# Patient Record
Sex: Female | Born: 1945 | Race: Black or African American | Hispanic: No | Smoking: Never smoker
Health system: Southern US, Community
[De-identification: ages and names within clinical notes are randomized; demographics above are authoritative.]

## PROBLEM LIST (undated history)

## (undated) DIAGNOSIS — M8580 Other specified disorders of bone density and structure, unspecified site: Secondary | ICD-10-CM

## (undated) DIAGNOSIS — K219 Gastro-esophageal reflux disease without esophagitis: Secondary | ICD-10-CM

## (undated) DIAGNOSIS — D0510 Intraductal carcinoma in situ of unspecified breast: Secondary | ICD-10-CM

## (undated) DIAGNOSIS — O039 Complete or unspecified spontaneous abortion without complication: Secondary | ICD-10-CM

## (undated) DIAGNOSIS — M353 Polymyalgia rheumatica: Secondary | ICD-10-CM

## (undated) DIAGNOSIS — F329 Major depressive disorder, single episode, unspecified: Secondary | ICD-10-CM

## (undated) DIAGNOSIS — K449 Diaphragmatic hernia without obstruction or gangrene: Secondary | ICD-10-CM

## (undated) DIAGNOSIS — K648 Other hemorrhoids: Secondary | ICD-10-CM

## (undated) DIAGNOSIS — K2970 Gastritis, unspecified, without bleeding: Secondary | ICD-10-CM

## (undated) DIAGNOSIS — I10 Essential (primary) hypertension: Secondary | ICD-10-CM

## (undated) DIAGNOSIS — M1990 Unspecified osteoarthritis, unspecified site: Secondary | ICD-10-CM

## (undated) DIAGNOSIS — F32A Depression, unspecified: Secondary | ICD-10-CM

## (undated) HISTORY — DX: Polymyalgia rheumatica: M35.3

## (undated) HISTORY — DX: Diaphragmatic hernia without obstruction or gangrene: K44.9

## (undated) HISTORY — PX: BUNIONECTOMY: SHX129

## (undated) HISTORY — DX: Intraductal carcinoma in situ of unspecified breast: D05.10

## (undated) HISTORY — DX: Unspecified osteoarthritis, unspecified site: M19.90

## (undated) HISTORY — PX: BREAST LUMPECTOMY: SHX2

## (undated) HISTORY — PX: TOE SURGERY: SHX1073

## (undated) HISTORY — DX: Gastritis, unspecified, without bleeding: K29.70

## (undated) HISTORY — DX: Other hemorrhoids: K64.8

## (undated) HISTORY — PX: CERVICAL CONE BIOPSY: SUR198

## (undated) HISTORY — PX: OTHER SURGICAL HISTORY: SHX169

## (undated) HISTORY — DX: Depression, unspecified: F32.A

## (undated) HISTORY — PX: CARPAL TUNNEL RELEASE: SHX101

## (undated) HISTORY — DX: Major depressive disorder, single episode, unspecified: F32.9

## (undated) HISTORY — DX: Essential (primary) hypertension: I10

## (undated) HISTORY — DX: Other specified disorders of bone density and structure, unspecified site: M85.80

## (undated) HISTORY — PX: DILATION AND CURETTAGE OF UTERUS: SHX78

## (undated) HISTORY — DX: Complete or unspecified spontaneous abortion without complication: O03.9

## (undated) HISTORY — DX: Gastro-esophageal reflux disease without esophagitis: K21.9

---

## 1998-06-15 ENCOUNTER — Other Ambulatory Visit: Admission: RE | Admit: 1998-06-15 | Discharge: 1998-06-15 | Payer: Self-pay | Admitting: Gynecology

## 1998-10-26 ENCOUNTER — Ambulatory Visit (HOSPITAL_COMMUNITY): Admission: RE | Admit: 1998-10-26 | Discharge: 1998-10-26 | Payer: Self-pay | Admitting: Gastroenterology

## 1998-11-07 ENCOUNTER — Other Ambulatory Visit: Admission: RE | Admit: 1998-11-07 | Discharge: 1998-11-07 | Payer: Self-pay | Admitting: Gynecology

## 1999-07-11 ENCOUNTER — Other Ambulatory Visit: Admission: RE | Admit: 1999-07-11 | Discharge: 1999-07-11 | Payer: Self-pay | Admitting: Gynecology

## 1999-08-11 ENCOUNTER — Encounter (INDEPENDENT_AMBULATORY_CARE_PROVIDER_SITE_OTHER): Payer: Self-pay

## 1999-08-11 ENCOUNTER — Other Ambulatory Visit: Admission: RE | Admit: 1999-08-11 | Discharge: 1999-08-11 | Payer: Self-pay | Admitting: Gynecology

## 1999-09-06 ENCOUNTER — Encounter: Payer: Self-pay | Admitting: Family Medicine

## 1999-09-06 ENCOUNTER — Ambulatory Visit (HOSPITAL_COMMUNITY): Admission: RE | Admit: 1999-09-06 | Discharge: 1999-09-06 | Payer: Self-pay | Admitting: Family Medicine

## 1999-09-21 ENCOUNTER — Encounter (INDEPENDENT_AMBULATORY_CARE_PROVIDER_SITE_OTHER): Payer: Self-pay | Admitting: Specialist

## 1999-09-21 ENCOUNTER — Other Ambulatory Visit: Admission: RE | Admit: 1999-09-21 | Discharge: 1999-09-21 | Payer: Self-pay | Admitting: Gynecology

## 1999-12-27 ENCOUNTER — Encounter: Payer: Self-pay | Admitting: Gynecology

## 1999-12-27 ENCOUNTER — Encounter: Admission: RE | Admit: 1999-12-27 | Discharge: 1999-12-27 | Payer: Self-pay | Admitting: Gynecology

## 2000-05-08 ENCOUNTER — Other Ambulatory Visit: Admission: RE | Admit: 2000-05-08 | Discharge: 2000-05-08 | Payer: Self-pay | Admitting: Gynecology

## 2000-05-09 ENCOUNTER — Other Ambulatory Visit: Admission: RE | Admit: 2000-05-09 | Discharge: 2000-05-09 | Payer: Self-pay | Admitting: Gynecology

## 2000-05-09 ENCOUNTER — Encounter (INDEPENDENT_AMBULATORY_CARE_PROVIDER_SITE_OTHER): Payer: Self-pay

## 2000-10-02 ENCOUNTER — Other Ambulatory Visit: Admission: RE | Admit: 2000-10-02 | Discharge: 2000-10-02 | Payer: Self-pay | Admitting: Gynecology

## 2001-02-14 ENCOUNTER — Encounter: Payer: Self-pay | Admitting: Gynecology

## 2001-02-14 ENCOUNTER — Encounter: Admission: RE | Admit: 2001-02-14 | Discharge: 2001-02-14 | Payer: Self-pay | Admitting: Gynecology

## 2001-02-18 ENCOUNTER — Other Ambulatory Visit: Admission: RE | Admit: 2001-02-18 | Discharge: 2001-02-18 | Payer: Self-pay | Admitting: Gynecology

## 2001-12-11 ENCOUNTER — Encounter: Payer: Self-pay | Admitting: Gynecology

## 2001-12-11 ENCOUNTER — Encounter: Admission: RE | Admit: 2001-12-11 | Discharge: 2001-12-11 | Payer: Self-pay | Admitting: Gynecology

## 2002-12-30 ENCOUNTER — Encounter: Admission: RE | Admit: 2002-12-30 | Discharge: 2002-12-30 | Payer: Self-pay | Admitting: Gynecology

## 2002-12-30 ENCOUNTER — Encounter: Payer: Self-pay | Admitting: Gynecology

## 2003-01-26 ENCOUNTER — Other Ambulatory Visit: Admission: RE | Admit: 2003-01-26 | Discharge: 2003-01-26 | Payer: Self-pay | Admitting: Gynecology

## 2004-01-03 ENCOUNTER — Encounter: Admission: RE | Admit: 2004-01-03 | Discharge: 2004-01-03 | Payer: Self-pay | Admitting: Gynecology

## 2004-01-11 ENCOUNTER — Encounter (INDEPENDENT_AMBULATORY_CARE_PROVIDER_SITE_OTHER): Payer: Self-pay | Admitting: Specialist

## 2004-01-11 ENCOUNTER — Encounter: Admission: RE | Admit: 2004-01-11 | Discharge: 2004-01-11 | Payer: Self-pay | Admitting: Gynecology

## 2004-01-17 ENCOUNTER — Encounter (HOSPITAL_COMMUNITY): Admission: RE | Admit: 2004-01-17 | Discharge: 2004-04-16 | Payer: Self-pay | Admitting: *Deleted

## 2004-02-09 ENCOUNTER — Other Ambulatory Visit: Admission: RE | Admit: 2004-02-09 | Discharge: 2004-02-09 | Payer: Self-pay | Admitting: Gynecology

## 2004-10-15 DIAGNOSIS — D0510 Intraductal carcinoma in situ of unspecified breast: Secondary | ICD-10-CM

## 2004-10-15 HISTORY — DX: Intraductal carcinoma in situ of unspecified breast: D05.10

## 2004-11-29 ENCOUNTER — Ambulatory Visit (HOSPITAL_COMMUNITY): Admission: RE | Admit: 2004-11-29 | Discharge: 2004-11-29 | Payer: Self-pay | Admitting: Cardiology

## 2005-01-03 ENCOUNTER — Ambulatory Visit: Payer: Self-pay | Admitting: Internal Medicine

## 2005-02-08 ENCOUNTER — Ambulatory Visit: Payer: Self-pay | Admitting: Internal Medicine

## 2005-07-09 ENCOUNTER — Other Ambulatory Visit: Admission: RE | Admit: 2005-07-09 | Discharge: 2005-07-09 | Payer: Self-pay | Admitting: Gynecology

## 2006-07-17 ENCOUNTER — Other Ambulatory Visit: Admission: RE | Admit: 2006-07-17 | Discharge: 2006-07-17 | Payer: Self-pay | Admitting: Gynecology

## 2006-09-12 ENCOUNTER — Encounter: Admission: RE | Admit: 2006-09-12 | Discharge: 2006-09-12 | Payer: Self-pay | Admitting: Family Medicine

## 2006-11-22 ENCOUNTER — Encounter: Admission: RE | Admit: 2006-11-22 | Discharge: 2006-11-22 | Payer: Self-pay | Admitting: Family Medicine

## 2007-06-19 ENCOUNTER — Encounter: Admission: RE | Admit: 2007-06-19 | Discharge: 2007-06-19 | Payer: Self-pay | Admitting: Family Medicine

## 2007-09-04 ENCOUNTER — Other Ambulatory Visit: Admission: RE | Admit: 2007-09-04 | Discharge: 2007-09-04 | Payer: Self-pay | Admitting: Gynecology

## 2008-06-14 ENCOUNTER — Encounter: Admission: RE | Admit: 2008-06-14 | Discharge: 2008-06-14 | Payer: Self-pay | Admitting: Family Medicine

## 2008-09-29 ENCOUNTER — Encounter: Admission: RE | Admit: 2008-09-29 | Discharge: 2008-09-29 | Payer: Self-pay | Admitting: Family Medicine

## 2008-12-09 ENCOUNTER — Ambulatory Visit: Payer: Self-pay | Admitting: Gynecology

## 2008-12-09 ENCOUNTER — Other Ambulatory Visit: Admission: RE | Admit: 2008-12-09 | Discharge: 2008-12-09 | Payer: Self-pay | Admitting: Gynecology

## 2008-12-09 ENCOUNTER — Encounter: Payer: Self-pay | Admitting: Gynecology

## 2009-11-10 ENCOUNTER — Encounter: Admission: RE | Admit: 2009-11-10 | Discharge: 2009-11-10 | Payer: Self-pay | Admitting: Rheumatology

## 2010-01-03 ENCOUNTER — Other Ambulatory Visit: Admission: RE | Admit: 2010-01-03 | Discharge: 2010-01-03 | Payer: Self-pay | Admitting: Gynecology

## 2010-01-03 ENCOUNTER — Ambulatory Visit: Payer: Self-pay | Admitting: Gynecology

## 2010-07-31 IMAGING — CR DG CHEST 2V
2 series · 2 of 2 positions shown · non-contrast
Comparison: CT chest of 11/29/2004

CLINICAL DATA: Right chest wall pain, no trauma

CHEST - 2 VIEW

[w chest pa]
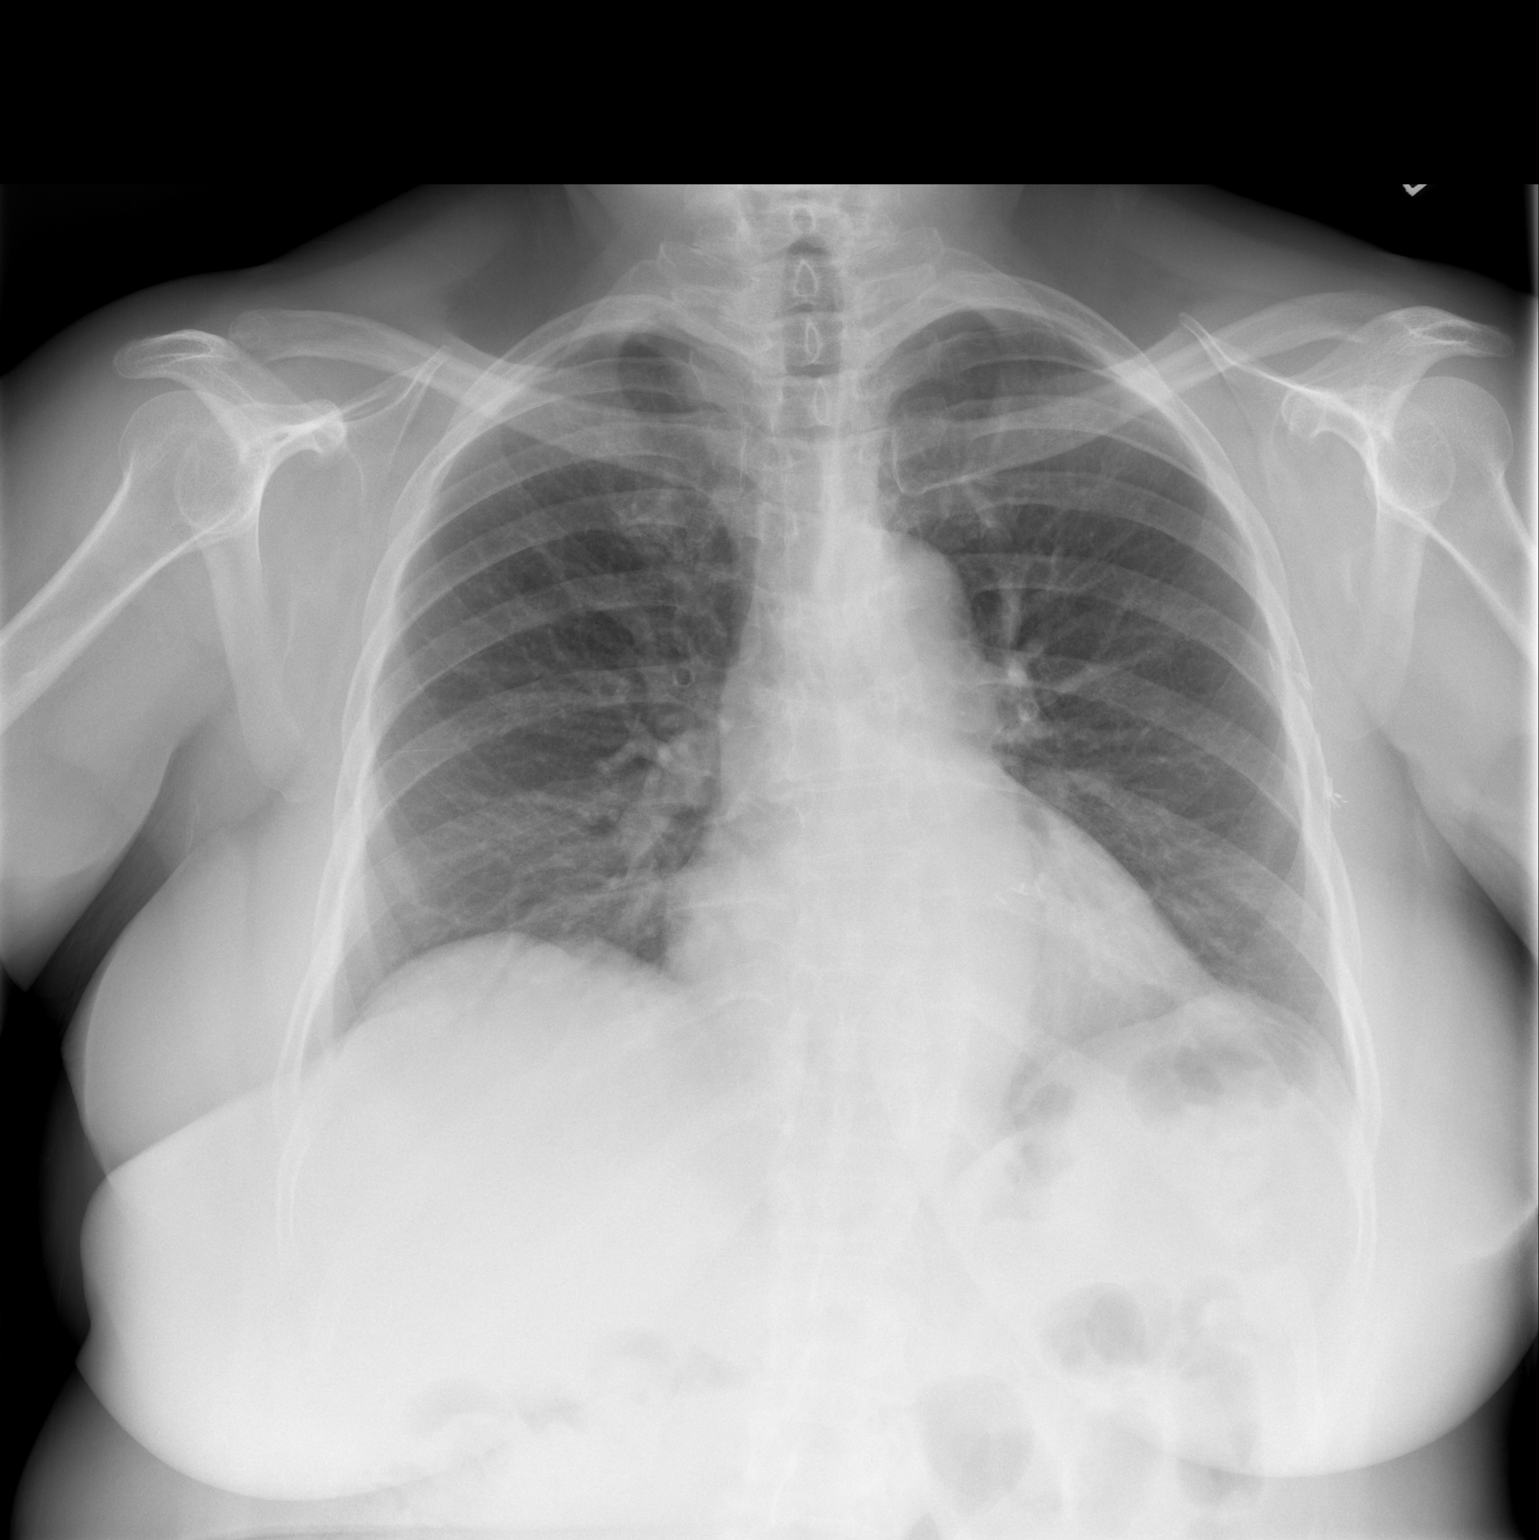

[w chest lat]
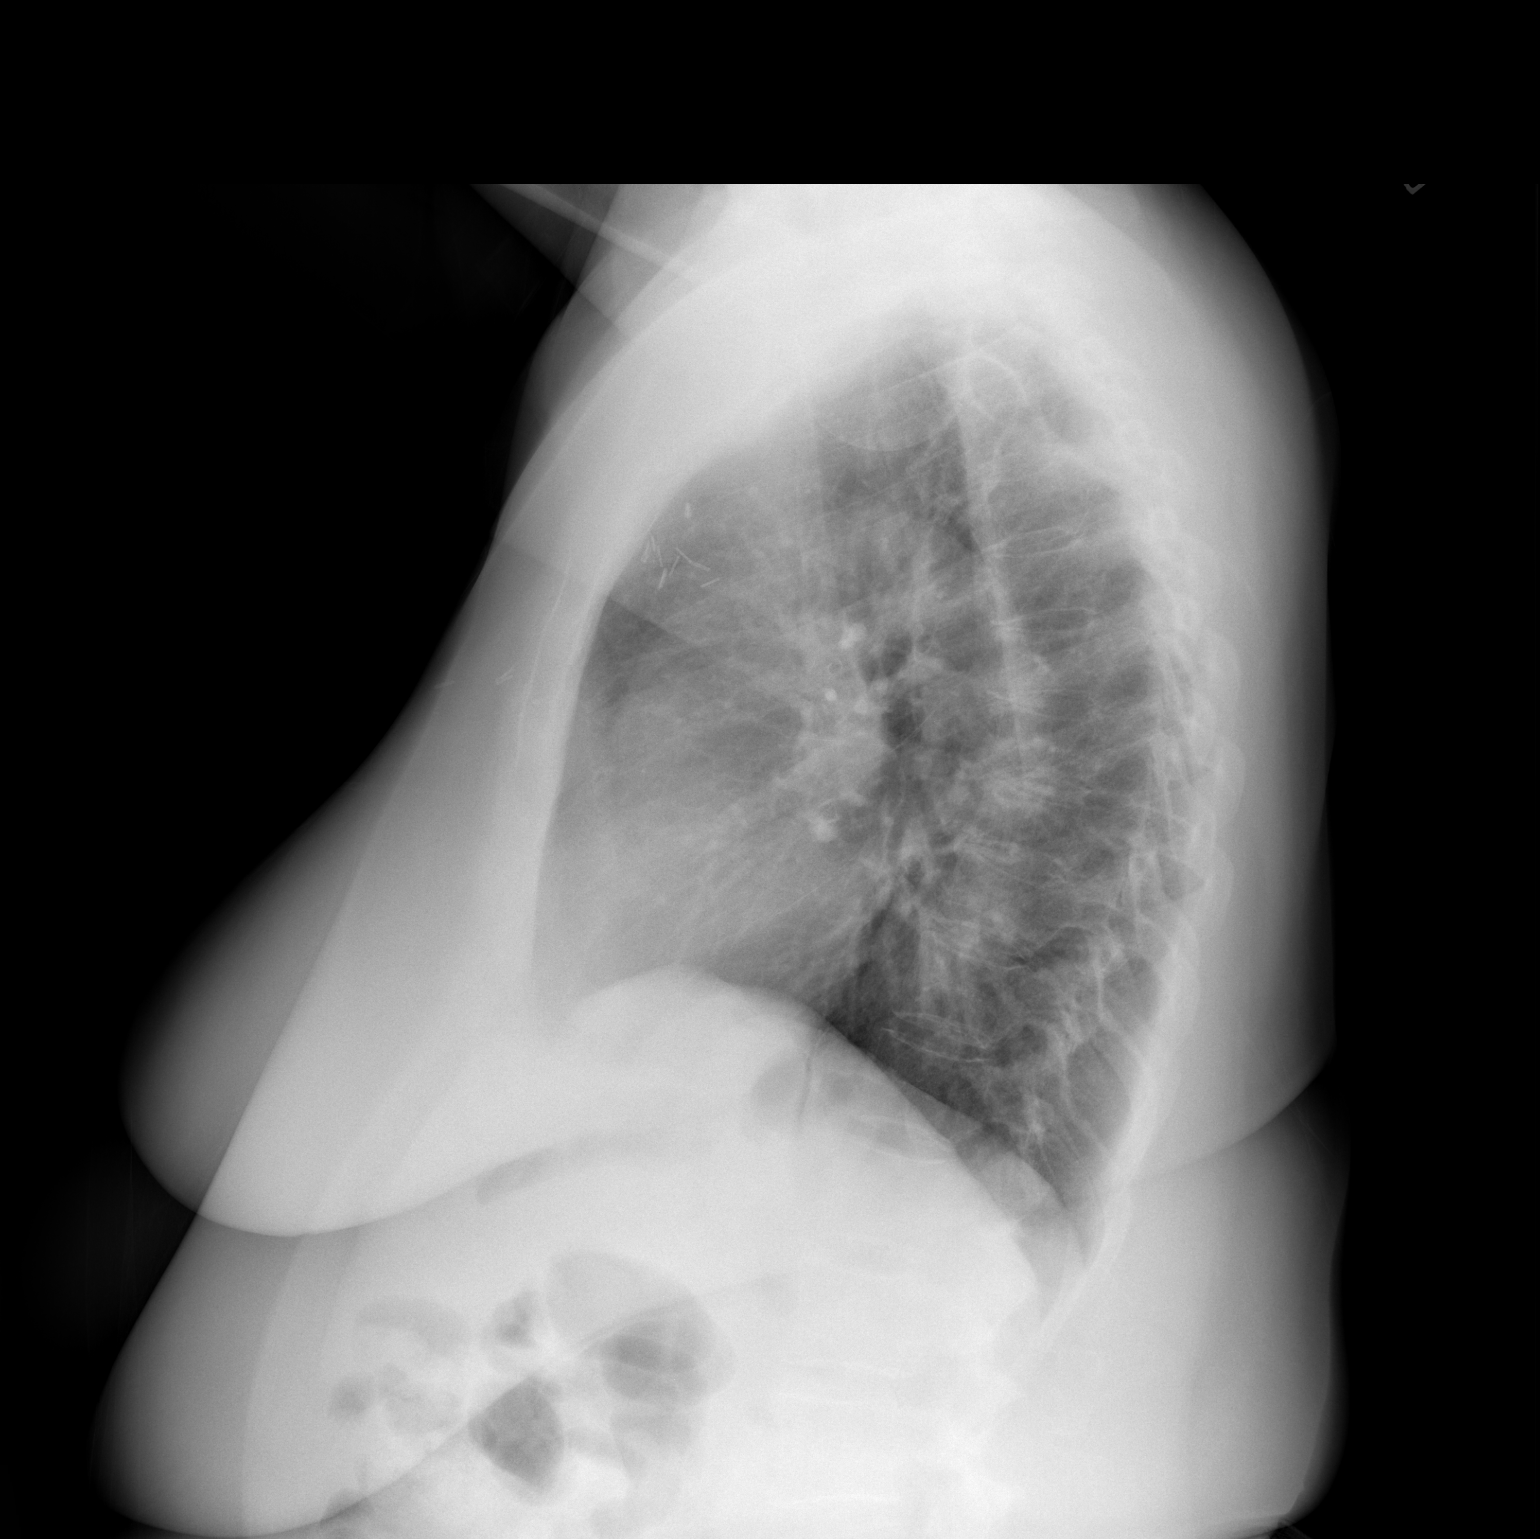

[2 of 2 positions shown; findings below may reference images not displayed]

FINDINGS: The lungs are clear.  The heart is within normal limits
in size.  Surgical clips overlie the left axilla.  No acute bony
abnormality is seen.
IMPRESSION: No active lung disease.

## 2011-01-09 ENCOUNTER — Other Ambulatory Visit: Payer: Self-pay | Admitting: Gynecology

## 2011-01-09 DIAGNOSIS — Z780 Asymptomatic menopausal state: Secondary | ICD-10-CM

## 2011-01-12 ENCOUNTER — Other Ambulatory Visit: Payer: Self-pay

## 2011-01-17 ENCOUNTER — Ambulatory Visit: Payer: Self-pay

## 2011-01-19 ENCOUNTER — Other Ambulatory Visit: Payer: Self-pay

## 2011-01-23 ENCOUNTER — Ambulatory Visit
Admission: RE | Admit: 2011-01-23 | Discharge: 2011-01-23 | Disposition: A | Discharge status: Home / Self Care | Payer: Self-pay | Source: Ambulatory Visit | Attending: Gynecology | Admitting: Gynecology

## 2011-01-23 DIAGNOSIS — Z780 Asymptomatic menopausal state: Secondary | ICD-10-CM

## 2011-01-26 ENCOUNTER — Encounter (INDEPENDENT_AMBULATORY_CARE_PROVIDER_SITE_OTHER): Payer: Medicare Other | Admitting: Gynecology

## 2011-01-26 ENCOUNTER — Other Ambulatory Visit: Payer: Self-pay | Admitting: Gynecology

## 2011-01-26 ENCOUNTER — Other Ambulatory Visit (HOSPITAL_COMMUNITY)
Admission: RE | Admit: 2011-01-26 | Discharge: 2011-01-26 | Disposition: A | Discharge status: Home / Self Care | Payer: Medicare Other | Source: Ambulatory Visit | Attending: Gynecology | Admitting: Gynecology

## 2011-01-26 DIAGNOSIS — M899 Disorder of bone, unspecified: Secondary | ICD-10-CM

## 2011-01-26 DIAGNOSIS — Z124 Encounter for screening for malignant neoplasm of cervix: Secondary | ICD-10-CM | POA: Insufficient documentation

## 2011-01-26 DIAGNOSIS — Z1211 Encounter for screening for malignant neoplasm of colon: Secondary | ICD-10-CM

## 2011-01-26 DIAGNOSIS — R635 Abnormal weight gain: Secondary | ICD-10-CM

## 2011-01-26 DIAGNOSIS — IMO0001 Reserved for inherently not codable concepts without codable children: Secondary | ICD-10-CM

## 2011-02-20 ENCOUNTER — Other Ambulatory Visit: Payer: 59

## 2011-07-09 ENCOUNTER — Ambulatory Visit (INDEPENDENT_AMBULATORY_CARE_PROVIDER_SITE_OTHER): Payer: Medicare Other | Admitting: *Deleted

## 2011-07-09 DIAGNOSIS — IMO0002 Reserved for concepts with insufficient information to code with codable children: Secondary | ICD-10-CM

## 2011-07-09 DIAGNOSIS — R7989 Other specified abnormal findings of blood chemistry: Secondary | ICD-10-CM

## 2011-07-09 DIAGNOSIS — Z79899 Other long term (current) drug therapy: Secondary | ICD-10-CM

## 2011-07-09 DIAGNOSIS — E673 Hypervitaminosis D: Secondary | ICD-10-CM

## 2011-07-09 DIAGNOSIS — R799 Abnormal finding of blood chemistry, unspecified: Secondary | ICD-10-CM

## 2011-07-09 DIAGNOSIS — E7800 Pure hypercholesterolemia, unspecified: Secondary | ICD-10-CM

## 2011-07-10 LAB — VITAMIN D 25 HYDROXY (VIT D DEFICIENCY, FRACTURES): Vit D, 25-Hydroxy: 59 ng/mL (ref 30–89)

## 2011-07-23 ENCOUNTER — Telehealth: Payer: Self-pay

## 2011-07-23 NOTE — Telephone Encounter (Signed)
Pt informed of lipid panel and vit. D results.

## 2011-12-17 ENCOUNTER — Encounter: Payer: Self-pay | Admitting: Internal Medicine

## 2012-01-30 ENCOUNTER — Ambulatory Visit (INDEPENDENT_AMBULATORY_CARE_PROVIDER_SITE_OTHER): Payer: Medicare Other | Admitting: Gynecology

## 2012-01-30 ENCOUNTER — Encounter: Payer: Self-pay | Admitting: Gynecology

## 2012-01-30 VITALS — BP 110/70 | Ht 61.0 in | Wt 192.0 lb

## 2012-01-30 DIAGNOSIS — F329 Major depressive disorder, single episode, unspecified: Secondary | ICD-10-CM

## 2012-01-30 DIAGNOSIS — M899 Disorder of bone, unspecified: Secondary | ICD-10-CM

## 2012-01-30 DIAGNOSIS — F32A Depression, unspecified: Secondary | ICD-10-CM

## 2012-01-30 DIAGNOSIS — I10 Essential (primary) hypertension: Secondary | ICD-10-CM | POA: Insufficient documentation

## 2012-01-30 DIAGNOSIS — N952 Postmenopausal atrophic vaginitis: Secondary | ICD-10-CM

## 2012-01-30 DIAGNOSIS — R1013 Epigastric pain: Secondary | ICD-10-CM

## 2012-01-30 DIAGNOSIS — R109 Unspecified abdominal pain: Secondary | ICD-10-CM

## 2012-01-30 DIAGNOSIS — C50919 Malignant neoplasm of unspecified site of unspecified female breast: Secondary | ICD-10-CM

## 2012-01-30 DIAGNOSIS — M8580 Other specified disorders of bone density and structure, unspecified site: Secondary | ICD-10-CM

## 2012-01-30 DIAGNOSIS — M069 Rheumatoid arthritis, unspecified: Secondary | ICD-10-CM

## 2012-01-30 NOTE — Progress Notes (Signed)
Kelly Pennington 05-30-1946 [id number]   History:    66 y.o.  For gynecological exam. 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 She has a followup appointment in May. Her last mammogram was normal in April of 2012. She's also been followed locally for her rheumatoid arthritis for which she is on chronic prednisone 5 mg daily. In April of 2012 her bone mineral density had demonstrated a significant decrease in bone mineral density by -4.5% at the [facility] spine. Her T score was -1.5 (osteopenia). She is taking calcium and vitamin D. Last year calcium vitamin D and PTH were normal after she was asked to decrease her vitamin D to 1000 units daily. Her last colonoscopy was in 2006 and she has been complaining of midepigastric pains sometimes worse by meals. She has been followed by Dr. Juanda Chance gastroenterologist.  Past medical history,surgical history, family history and social history were all reviewed and documented in the EPIC chart.  Gynecologic History No LMP recorded. Patient is postmenopausal. Contraception: none Last Pap: 2012. Results were: normal Last mammogram: 2012. Results were: normal  Obstetric History OB History    Grav Para Term Preterm Abortions TAB SAB Ect Mult Living   4 2 2  2  2   2      # Outc Date GA Lbr Len/2nd Wgt Sex Del Anes PTL Lv   1 TRM     F SVD  No Yes   2 TRM     F SVD  No Yes   3 SAB            4 SAB                ROS:  Was performed and pertinent positives and negatives are included in the history.  Exam: chaperone present  BP 110/70  Ht 5\' 1"  (1.549 m)  Wt 192 lb (87.091 kg)  BMI 36.28 kg/m2  Body mass index is 36.28 kg/(m^2).  General appearance : Well developed well nourished female. No acute distress HEENT: Neck supple, trachea  midline, no carotid bruits, no thyroidmegaly Lungs: Clear to auscultation, no rhonchi or wheezes, or rib retractions  Heart: Regular rate and rhythm, no murmurs or gallops Breast:Examined in sitting and supine position were symmetrical in appearance, no palpable masses or tenderness,  no skin retraction, no nipple inversion, no nipple discharge, no skin discoloration, no axillary or supraclavicular lymphadenopathy. Scar tissue from previous left lumpectomy. Abdomen: no palpable masses midepigastric tenderness was elicited on the breast inspiration but no rebound or guarding.  Extremities: no edema or skin discoloration or tenderness  Pelvic:  Bartholin, Urethra, Skene Glands: Within normal limits             Vagina: No gross lesions or discharge  Cervix: No gross lesions or discharge  Uterus  axial, normal size, shape and consistency, non-tender and mobile  Adnexa  Without masses or tenderness  Anus and perineum  normal   Rectovaginal  normal sphincter tone without palpated masses or tenderness             Hemoccult fecal: Given to the patient to submit to the office for testing . No rectal masses detected on digital exam.    Assessment/Plan:  66 y.o. female with normal gynecological examination status post left lumpectomy and chemotherapy and  radiation therapy in 2006 for left breast invasive intraductal carcinoma. Patient with midepigastric pains regardless of type of meals although spicy foods and high fat-containing foods exacerbate her midepigastric discomfort. She denies any hematochezia. We'll order an upper gum ultrasound to rule out cholelithiasis and have her followup with Dr. Juanda Chance gastroenterologist which she has seen before for followup. She was instructed to continue her monthly self breast examination. We did discuss that with her high risk for osteoporosis as a result of being on chronic daily steroids that she would benefit from daily Evista (selective estrogen receptor  modulator) as well as to reduce her risk of invasive breast cancer on her contralateral breast. She will discuss this with her medical oncologist at [facility] and have given her the information. She was instructed to continue her weightbearing exercises at least 45 minutes 3 or 4 times a week and to continue her calcium and vitamin D to prevent osteoporosis. She was reminded to submit to the office the Hemoccult cards for evaluation. Will call her with the above appointments. We did discuss the new Pap smear screening guidelines and since she is over 15 years of age with prior normal Pap smears will no longer need them. She still does need her annual gynecological exam.   Ok Edwards MD, 4:59 PM 01/30/2012

## 2012-01-30 NOTE — Patient Instructions (Signed)
Will call you with appointment for ultrasound and then to see Dr. Juanda Chance. Remember to discuss with oncologist about the Evista.

## 2012-02-01 ENCOUNTER — Other Ambulatory Visit: Payer: Self-pay | Admitting: *Deleted

## 2012-02-01 ENCOUNTER — Telehealth: Payer: Self-pay | Admitting: *Deleted

## 2012-02-01 ENCOUNTER — Telehealth: Payer: Self-pay | Admitting: Internal Medicine

## 2012-02-01 DIAGNOSIS — R1013 Epigastric pain: Secondary | ICD-10-CM

## 2012-02-01 NOTE — Telephone Encounter (Signed)
Patient informed appt set with Dr. Juanda Chance on 02/20/12 @ 9:30am.

## 2012-02-01 NOTE — Telephone Encounter (Signed)
Spoke with Amy and scheduled on 02/20/12 at 9:30 AM with Dr. Juanda Chance.

## 2012-02-01 NOTE — Telephone Encounter (Signed)
Patient informed ultrasound set for 02/06/12 @9am  NPO after midnight and called Dr. Regino Schultze office and they will call me back about appt for patient beings Dr. Juanda Chance is booked out till June.

## 2012-02-01 NOTE — Telephone Encounter (Signed)
Message copied by Libby Maw on Fri Feb 01, 2012  3:55 PM ------      Message from: Ok Edwards      Created: Wed Jan 30, 2012  5:16 PM       Liza Czerwinski, please schedule upper abdominal ultrasound for this patient with midepigastric pains, also an appointment to see Dr. Lina Sar gastroenterologist the week after the ultrasound. Patient has seen her before in the past. See encounter  note from 01/30/2012 for details thank you

## 2012-02-06 ENCOUNTER — Ambulatory Visit (HOSPITAL_COMMUNITY)
Admission: RE | Admit: 2012-02-06 | Discharge: 2012-02-06 | Disposition: A | Discharge status: Home / Self Care | Payer: Medicare Other | Source: Ambulatory Visit | Attending: Gynecology | Admitting: Gynecology

## 2012-02-06 DIAGNOSIS — N270 Small kidney, unilateral: Secondary | ICD-10-CM | POA: Insufficient documentation

## 2012-02-06 DIAGNOSIS — R109 Unspecified abdominal pain: Secondary | ICD-10-CM | POA: Insufficient documentation

## 2012-02-06 DIAGNOSIS — R1013 Epigastric pain: Secondary | ICD-10-CM | POA: Insufficient documentation

## 2012-02-20 ENCOUNTER — Ambulatory Visit: Payer: Medicare Other | Admitting: Internal Medicine

## 2012-03-26 ENCOUNTER — Encounter: Payer: Self-pay | Admitting: *Deleted

## 2012-03-31 ENCOUNTER — Encounter: Payer: Self-pay | Admitting: Internal Medicine

## 2012-04-15 ENCOUNTER — Encounter: Payer: Self-pay | Admitting: Internal Medicine

## 2012-04-15 ENCOUNTER — Ambulatory Visit (INDEPENDENT_AMBULATORY_CARE_PROVIDER_SITE_OTHER): Payer: Medicare Other | Admitting: Internal Medicine

## 2012-04-15 VITALS — BP 124/60 | HR 68 | Ht 61.0 in | Wt 193.0 lb

## 2012-04-15 DIAGNOSIS — Z86010 Personal history of colonic polyps: Secondary | ICD-10-CM

## 2012-04-15 DIAGNOSIS — R1013 Epigastric pain: Secondary | ICD-10-CM

## 2012-04-15 MED ORDER — DICLOFENAC SODIUM 75 MG PO TBEC: 75.0000 mg | DELAYED_RELEASE_TABLET | Freq: Two times a day (BID) | ORAL | Status: AC

## 2012-04-15 MED ORDER — MOVIPREP 100 G PO SOLR: ORAL | Status: DC

## 2012-04-15 NOTE — Patient Instructions (Addendum)
You have been scheduled for an endoscopy and colonoscopy with propofol. Please follow the written instructions given to you at your visit today. Please pick up your prep at the pharmacy within the next 1-3 days. We have sent the following medications to your pharmacy for you to pick up at your convenience: Voltaren CC: Dr Renaye Rakers, Dr J.Fernandez

## 2012-04-15 NOTE — Progress Notes (Signed)
Kelly Pennington 1946-09-29 MRN [id number]   History of Present Illness:  This is a 66 year old white female with epigastric pain which she attributes to taking excess Motrin up to 8 a day for several months for control of back pain. The abdominal  pain is worse with eating. She denies any black stools or hematemesis. An upper abdominal ultrasound in April 2013 showed a small right kidney but was otherwise normal. She has a history of urinary tract infections in the past. She had 2 prior colonoscopies in 2000 and in April 2006 which showed melanosis and grade 1 hemorrhoids. There is a family history of peptic ulcer disease in several relatives from her father's side and a family history of Crohn's disease on her mother's side. She has a diagnosis of PMR followed by Dr Corliss Skains. Since she has cut back on her Motrin to 4 a day alternating with Tylenol, the abdominal pain has improved. She has been on Nexium 40 mg only as needed.   Past Medical History  Diagnosis Date  . NSVD (normal spontaneous vaginal delivery)     X2  . SAB (spontaneous abortion)     X2  . Hypertension   . Depression   . GERD (gastroesophageal reflux disease)   . Osteopenia   . Polymyalgia rheumatica   . Intraductal carcinoma of breast 2006    left  . Arthritis   . Internal hemorrhoids    Past Surgical History  Procedure Date  . Cervical cone biopsy   . Breast lumpectomy   . Bunionectomy     LEFT  . Carpal tunnel release     BILATERAL  . Dilation and curettage of uterus   . Spontaneous abortion     x 2    reports that she has never smoked. She has never used smokeless tobacco. She reports that she does not drink alcohol or use illicit drugs. family history includes Colon cancer in her maternal grandmother; Crohn's disease in her brother; Diabetes in her maternal aunt; Heart disease in her father and paternal aunt; Liver cancer in her brother; and Pancreatic cancer in her mother. Allergies  Allergen Reactions  .  Other     SHELL FISH  . Penicillins         Review of Systems: Denies dysphagia odynophagia. Denies heartburn. Negative for change in bowel habits  The remainder of the 10 point ROS is negative except as outlined in H&P   Physical Exam: General appearance  Well developed, in no distress. Eyes- non icteric. HEENT nontraumatic, normocephalic. Mouth no lesions, tongue papillated, no cheilosis. Neck supple without adenopathy, thyroid not enlarged, no carotid bruits, no JVD. Lungs Clear to auscultation bilaterally. Cor normal S1, normal S2, regular rhythm, no murmur,  quiet precordium. Abdomen: Soft with tenderness in midepigastrium. No distention. Normal active bowel sounds. Liver edge at costal margin. Rectal: Moderate amount of fall Hemoccult negative stool. Extremities no pedal edema. Skin no lesions. Neurological alert and oriented x 3. Psychological normal mood and affect.  Assessment and Plan  Problem #1 Epigastric pain most likely related to gastropathy from Motrin. We will proceed with an upper endoscopy to rule out other causes such as gastric ulcer or H. pylori gastritis. She will start taking her Nexium on a regular basis and I will switch her from Motrin to diclofenac 75 mg once to twice a day.  Problem #2 Colorectal screening. Her last colonoscopy was in April 2006. She has a personal history of breast cancer. We will do a  screening colonoscopy at the time of the upper endoscopy.   04/15/2012 Lina Sar

## 2012-06-04 ENCOUNTER — Ambulatory Visit (AMBULATORY_SURGERY_CENTER): Payer: Medicare Other | Admitting: Internal Medicine

## 2012-06-04 ENCOUNTER — Encounter: Payer: Self-pay | Admitting: Internal Medicine

## 2012-06-04 VITALS — BP 136/81 | HR 67 | Temp 97.9°F | Resp 19 | Ht 61.0 in | Wt 193.0 lb

## 2012-06-04 DIAGNOSIS — K2970 Gastritis, unspecified, without bleeding: Secondary | ICD-10-CM

## 2012-06-04 DIAGNOSIS — D130 Benign neoplasm of esophagus: Secondary | ICD-10-CM

## 2012-06-04 DIAGNOSIS — Z1211 Encounter for screening for malignant neoplasm of colon: Secondary | ICD-10-CM

## 2012-06-04 DIAGNOSIS — K2100 Gastro-esophageal reflux disease with esophagitis, without bleeding: Secondary | ICD-10-CM

## 2012-06-04 DIAGNOSIS — R1013 Epigastric pain: Secondary | ICD-10-CM

## 2012-06-04 DIAGNOSIS — D131 Benign neoplasm of stomach: Secondary | ICD-10-CM

## 2012-06-04 DIAGNOSIS — K2990 Gastroduodenitis, unspecified, without bleeding: Secondary | ICD-10-CM

## 2012-06-04 MED ORDER — SODIUM CHLORIDE 0.9 % IV SOLN: 500.0000 mL | INTRAVENOUS | Status: DC

## 2012-06-04 MED ORDER — ESOMEPRAZOLE MAGNESIUM 40 MG PO PACK: 40.0000 mg | PACK | Freq: Every day | ORAL | Status: DC

## 2012-06-04 NOTE — Op Note (Signed)
[facility] [address] [place], [address]   ENDOSCOPY PROCEDURE REPORT  PATIENT: Kelly Pennington, Kelly Pennington  MR#: [id number] BIRTHDATE: 1946-09-07 , 66  yrs. old GENDER: Female ENDOSCOPIST: Hart Carwin, MD REFERRED BY:  Renaye Rakers, M.D. PROCEDURE DATE:  06/04/2012 PROCEDURE:  EGD w/ biopsy ASA CLASS:     Class II INDICATIONS:  dyspepsia.   pt has been taking Motrin. MEDICATIONS: MAC sedation, administered by CRNA and Propofol (Diprivan) 180 mg Iv TOPICAL ANESTHETIC: Cetacaine Spray  DESCRIPTION OF PROCEDURE: After the risks benefits and alternatives of the procedure were thoroughly explained, informed consent was obtained.  The LB-GIF Q180 [id number] endoscope was introduced through the mouth and advanced to the second portion of the duodenum. Without limitations.  The instrument was slowly withdrawn as the mucosa was fully examined.      ESOPHAGUS: Reflux esophagitis was found at the esophageal anastomosis.  Esophagitis was LA Grade A: breaks across 1-2 folds, <5 mm.  A biopsy was performed using cold forceps.   A medium sized hiatal hernia was noted.   A 3 cm hiatal hernia was noted.  Non reducible hiatal hernia.  STOMACH: Mild gastritis (inflammation) was found on the posterior wall of the gastric antrum.  A biopsy was performed using cold forceps.  Retroflexed views revealed no abnormalities.     The scope was then withdrawn from the patient and the procedure completed.  COMPLICATIONS: There were no complications. ENDOSCOPIC IMPRESSION: 1.   Esophagitis consistent with reflux esophagitis at the esophageal anastomosis; biopsy 2.   3 cm hiatal hernia 3..   Gastritis (inflammation) was found on the posterior wall of the gastric antrum; biopsy  RECOMMENDATIONS: 1.  Await biopsy results 2.  Anti-reflux regimen to be follow 3.  epigastric pain likely caused by hiatal henia and gastritis    eSigned:  Hart Carwin, MD 06/04/2012 4:01  PM   CC:  PATIENT NAME:  Bannie, Lobban MR#: [id number]

## 2012-06-04 NOTE — Patient Instructions (Addendum)

## 2012-06-04 NOTE — Op Note (Signed)
[facility] [address] [place], [address]   COLONOSCOPY PROCEDURE REPORT  PATIENT: Kelly Pennington, Kelly Pennington  MR#: [id number] BIRTHDATE: 1946/08/12 , 66  yrs. old GENDER: Female ENDOSCOPIST: Hart Carwin, MD REFERRED BY:  Renaye Rakers, M.D. PROCEDURE DATE:  06/04/2012 PROCEDURE:   Colonoscopy, screening ASA CLASS:   Class II INDICATIONS:average risk patient for colon cancer and prior colonoscopy 2000,2005. MEDICATIONS: MAC sedation, administered by CRNA and propofol (Diprivan) 100mg  IV  DESCRIPTION OF PROCEDURE:   After the risks and benefits and of the procedure were explained, informed consent was obtained.  A digital rectal exam revealed no abnormalities of the rectum.    The LB CF-H180AL [id number]  endoscope was introduced through the anus and advanced to the cecum, which was identified by both the appendix and ileocecal valve .  The quality of the prep was good, using MoviPrep .  The instrument was then slowly withdrawn as the colon was fully examined.     COLON FINDINGS: A normal appearing cecum, ileocecal valve, and appendiceal orifice were identified.  The ascending, hepatic flexure, transverse, splenic flexure, descending, sigmoid colon and rectum appeared unremarkable.  No polyps or cancers were seen. Retroflexed views revealed no abnormalities.     The scope was then withdrawn from the patient and the procedure completed.  COMPLICATIONS: There were no complications. ENDOSCOPIC IMPRESSION: Normal colon . no polyps  RECOMMENDATIONS: High fiber diet.   REPEAT EXAM: In 10 year(s)  for Colonoscopy.  cc:  _______________________________ eSignedHart Carwin, MD 06/04/2012 4:09 PM

## 2012-06-04 NOTE — Progress Notes (Signed)
Patient did not experience any of the following events: a burn prior to discharge; a fall within the facility; wrong site/side/patient/procedure/implant event; or a hospital transfer or hospital admission upon discharge from the facility. (G8907) Patient did not have preoperative order for IV antibiotic SSI prophylaxis. (G8918)  

## 2012-06-05 ENCOUNTER — Telehealth: Payer: Self-pay | Admitting: *Deleted

## 2012-06-05 NOTE — Telephone Encounter (Signed)
  Follow up Call-  Call back number 06/04/2012  Post procedure Call Back phone  # [phone]  Permission to leave phone message Yes     Patient questions:  Do you have a fever, pain , or abdominal swelling? no Pain Score  0 *  Have you tolerated food without any problems? yes  Have you been able to return to your normal activities? yes  Do you have any questions about your discharge instructions: Diet   no Medications  no Follow up visit  no  Do you have questions or concerns about your Care? no  Actions: * If pain score is 4 or above: No action needed, pain <4.

## 2012-06-16 ENCOUNTER — Encounter: Payer: Self-pay | Admitting: Internal Medicine

## 2012-08-08 ENCOUNTER — Other Ambulatory Visit: Payer: Self-pay | Admitting: Internal Medicine

## 2012-08-08 MED ORDER — ESOMEPRAZOLE MAGNESIUM 40 MG PO CPDR: 40.0000 mg | DELAYED_RELEASE_CAPSULE | Freq: Every day | ORAL | Status: DC

## 2012-08-08 NOTE — Telephone Encounter (Signed)
rx sent

## 2013-03-03 ENCOUNTER — Ambulatory Visit (INDEPENDENT_AMBULATORY_CARE_PROVIDER_SITE_OTHER): Payer: Medicare Other | Admitting: Gynecology

## 2013-03-03 ENCOUNTER — Encounter: Payer: Self-pay | Admitting: Gynecology

## 2013-03-03 VITALS — BP 118/72 | Ht 61.0 in | Wt 193.0 lb

## 2013-03-03 DIAGNOSIS — M949 Disorder of cartilage, unspecified: Secondary | ICD-10-CM

## 2013-03-03 DIAGNOSIS — M8580 Other specified disorders of bone density and structure, unspecified site: Secondary | ICD-10-CM | POA: Insufficient documentation

## 2013-03-03 DIAGNOSIS — M899 Disorder of bone, unspecified: Secondary | ICD-10-CM

## 2013-03-03 DIAGNOSIS — Z853 Personal history of malignant neoplasm of breast: Secondary | ICD-10-CM

## 2013-03-03 DIAGNOSIS — N952 Postmenopausal atrophic vaginitis: Secondary | ICD-10-CM

## 2013-03-03 DIAGNOSIS — Z23 Encounter for immunization: Secondary | ICD-10-CM

## 2013-03-03 DIAGNOSIS — Z1159 Encounter for screening for other viral diseases: Secondary | ICD-10-CM

## 2013-03-03 NOTE — Progress Notes (Addendum)
Kelly Pennington 1946-06-18 [id number]   History:    67 y.o.  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 She has a followup appointment in June.she will be getting her mammogram there  As  well. She's also been followed locally for her rheumatoid arthritis for which she is on chronic prednisone 5 mg daily. In April of 2012 her bone mineral density had demonstrated a significant decrease in bone mineral density by -4.5% at the [facility] spine. Her T score was -1.5 (osteopenia). She is taking calcium and vitamin D. Patient had a normal colonoscopy this year.patient's brother had hepatitis C and died of liver cancer. Patient has not received the Tdap vaccine or the shingles vaccine. Patient in her 49s had cryotherapy of her cervix for mild dysplasia and followup Pap smears have been normal since that time.  Past medical history,surgical history, family history and social history were all reviewed and documented in the EPIC chart.  Gynecologic History No LMP recorded. Patient is postmenopausal. Contraception: post menopausal status Last Pap: 2012. Results were: normal Last mammogram: see above. Results were: normal  Obstetric History OB History   Grav Para Term Preterm Abortions TAB SAB Ect Mult Living   4 2 2  2  2   2      # Outc Date GA Lbr Len/2nd Wgt Sex Del Anes PTL Lv   1 TRM     F SVD  No Yes   2 TRM     F SVD  No Yes   3 SAB            4 SAB                ROS: A ROS was performed and pertinent positives and negatives are included in the history.  GENERAL: No fevers or chills. HEENT: No change in vision, no earache, sore throat or sinus congestion. NECK: No pain or stiffness. CARDIOVASCULAR: No chest pain or pressure. No palpitations. PULMONARY: No shortness of  breath, cough or wheeze. GASTROINTESTINAL: No abdominal pain, nausea, vomiting or diarrhea, melena or bright red blood per rectum. GENITOURINARY: No urinary frequency, urgency, hesitancy or dysuria. MUSCULOSKELETAL: No joint or muscle pain, no back pain, no recent trauma. DERMATOLOGIC: No rash, no itching, no lesions. ENDOCRINE: No polyuria, polydipsia, no heat or cold intolerance. No recent change in weight. HEMATOLOGICAL: No anemia or easy bruising or bleeding. NEUROLOGIC: No headache, seizures, numbness, tingling or weakness. PSYCHIATRIC: No depression, no loss of interest in normal activity or change in sleep pattern.     Exam: chaperone present  BP 118/72  Ht 5\' 1"  (1.549 m)  Wt 193 lb (87.544 kg)  BMI 36.49 kg/m2  Body mass index is 36.49 kg/(m^2).  General appearance : Well developed well nourished female. No acute distress HEENT: Neck supple, trachea midline, no carotid bruits, no thyroidmegaly Lungs: Clear to auscultation, no rhonchi or wheezes, or rib retractions  Heart: Regular rate and rhythm, no murmurs or gallops Breast:Examined in sitting and supine position were symmetrical in appearance, no palpable masses or tenderness,  no skin retraction, no nipple inversion, no nipple discharge, no skin discoloration, no axillary or supraclavicular lymphadenopathy Abdomen: no palpable masses or tenderness, no rebound or guarding Extremities: no edema or skin discoloration or tenderness  Pelvic:  Bartholin, Urethra, Skene  Glands: Within normal limits             Vagina: No gross lesions or discharge,atrophic changes  Cervix: No gross lesions or discharge  Uterus  axial, normal size, shape and consistency, non-tender and mobile  Adnexa  Without masses or tenderness  Anus and perineum  normal   Rectovaginal  normal sphincter tone without palpated masses or tenderness             Hemoccult not done colonoscopy this year   New CDC guidelines is recommending patients be tested once in  her lifetime for hepatitis C antibody who were born between [birth year] through 1966. This was discussed with the patient today and has agreed to be tested today.  Assessment/Plan:  67 y.o. with history of osteopenia and rheumatoid arthritis currently on prednisone which is being tapered by her rheumatologist. Patient is now 12 mg daily. Patient will schedule a bone density study in the next few weeks. We are going to check her vitamin D level today. Her primary physician Dr. Parke Simmers has also been doing her lab work so no lab work will be done today. We will schedule an ultrasound the next few weeks for better assessment of her ovaries due to her history of breast cancer. No Pap smear done today the new screening guidelines discussed. We discussed importance of calcium and vitamin D and regular exercise for osteoporosis prevention. It was recommended that she use either refresh or luvena probiotic gel twice a week for vaginal dryness. Patient received a Tdap vaccine today and literature information was provided. Prescription for shingles vaccine was provided as well.    Ok Edwards MD, 12:40 PM 03/03/2013

## 2013-03-03 NOTE — Patient Instructions (Addendum)
Tetanus, Diphtheria, Pertussis (Tdap) Vaccine What You Need to Know WHY GET VACCINATED? Tetanus, diphtheria and pertussis can be very serious diseases, even for adolescents and adults. Tdap vaccine can protect us from these diseases. TETANUS (Lockjaw) causes painful muscle tightening and stiffness, usually all over the body.  It can lead to tightening of muscles in the head and neck so you can't open your mouth, swallow, or sometimes even breathe. Tetanus kills about 1 out of 5 people who are infected. DIPHTHERIA can cause a thick coating to form in the back of the throat.  It can lead to breathing problems, paralysis, heart failure, and death. PERTUSSIS (Whooping Cough) causes severe coughing spells, which can cause difficulty breathing, vomiting and disturbed sleep.  It can also lead to weight loss, incontinence, and rib fractures. Up to 2 in 100 adolescents and 5 in 100 adults with pertussis are hospitalized or have complications, which could include pneumonia and death. These diseases are caused by bacteria. Diphtheria and pertussis are spread from person to person through coughing or sneezing. Tetanus enters the body through cuts, scratches, or wounds. Before vaccines, the [place] saw as many as 200,000 cases a year of diphtheria and pertussis, and hundreds of cases of tetanus. Since vaccination began, tetanus and diphtheria have dropped by about 99% and pertussis by about 80%. TDAP VACCINE Tdap vaccine can protect adolescents and adults from tetanus, diphtheria, and pertussis. One dose of Tdap is routinely given at age 11 or 12. People who did not get Tdap at that age should get it as soon as possible. Tdap is especially important for health care professionals and anyone having close contact with a baby younger than 12 months. Pregnant women should get a dose of Tdap during every pregnancy, to protect the newborn from pertussis. Infants are most at risk for severe, life-threatening  complications from pertussis. A similar vaccine, called Td, protects from tetanus and diphtheria, but not pertussis. A Td booster should be given every 10 years. Tdap may be given as one of these boosters if you have not already gotten a dose. Tdap may also be given after a severe cut or burn to prevent tetanus infection. Your doctor can give you more information. Tdap may safely be given at the same time as other vaccines. SOME PEOPLE SHOULD NOT GET THIS VACCINE  If you ever had a life-threatening allergic reaction after a dose of any tetanus, diphtheria, or pertussis containing vaccine, OR if you have a severe allergy to any part of this vaccine, you should not get Tdap. Tell your doctor if you have any severe allergies.  If you had a coma, or long or multiple seizures within 7 days after a childhood dose of DTP or DTaP, you should not get Tdap, unless a cause other than the vaccine was found. You can still get Td.  Talk to your doctor if you:  have epilepsy or another nervous system problem,  had severe pain or swelling after any vaccine containing diphtheria, tetanus or pertussis,  ever had Guillain-Barr Syndrome (GBS),  aren't feeling well on the day the shot is scheduled. RISKS OF A VACCINE REACTION With any medicine, including vaccines, there is a chance of side effects. These are usually mild and go away on their own, but serious reactions are also possible. Brief fainting spells can follow a vaccination, leading to injuries from falling. Sitting or lying down for about 15 minutes can help prevent these. Tell your doctor if you feel dizzy or light-headed, or   have vision changes or ringing in the ears. Mild problems following Tdap (Did not interfere with activities)  Pain where the shot was given (about 3 in 4 adolescents or 2 in 3 adults)  Redness or swelling where the shot was given (about 1 person in 5)  Mild fever of at least 100.4F (up to about 1 in 25 adolescents or 1 in  100 adults)  Headache (about 3 or 4 people in 10)  Tiredness (about 1 person in 3 or 4)  Nausea, vomiting, diarrhea, stomach ache (up to 1 in 4 adolescents or 1 in 10 adults)  Chills, body aches, sore joints, rash, swollen glands (uncommon) Moderate problems following Tdap (Interfered with activities, but did not require medical attention)  Pain where the shot was given (about 1 in 5 adolescents or 1 in 100 adults)  Redness or swelling where the shot was given (up to about 1 in 16 adolescents or 1 in 25 adults)  Fever over 102F (about 1 in 100 adolescents or 1 in 250 adults)  Headache (about 3 in 20 adolescents or 1 in 10 adults)  Nausea, vomiting, diarrhea, stomach ache (up to 1 or 3 people in 100)  Swelling of the entire arm where the shot was given (up to about 3 in 100). Severe problems following Tdap (Unable to perform usual activities, required medical attention)  Swelling, severe pain, bleeding and redness in the arm where the shot was given (rare). A severe allergic reaction could occur after any vaccine (estimated less than 1 in a million doses). WHAT IF THERE IS A SERIOUS REACTION? What should I look for?  Look for anything that concerns you, such as signs of a severe allergic reaction, very high fever, or behavior changes. Signs of a severe allergic reaction can include hives, swelling of the face and throat, difficulty breathing, a fast heartbeat, dizziness, and weakness. These would start a few minutes to a few hours after the vaccination. What should I do?  If you think it is a severe allergic reaction or other emergency that can't wait, call 9-1-1 or get the person to the nearest hospital. Otherwise, call your doctor.  Afterward, the reaction should be reported to the "Vaccine Adverse Event Reporting System" (VAERS). Your doctor might file this report, or you can do it yourself through the VAERS web site at www.vaers.hhs.gov, or by calling 1-800-822-7967. VAERS is  only for reporting reactions. They do not give medical advice.  THE NATIONAL VACCINE INJURY COMPENSATION PROGRAM The National Vaccine Injury Compensation Program (VICP) is a federal program that was created to compensate people who may have been injured by certain vaccines. Persons who believe they may have been injured by a vaccine can learn about the program and about filing a claim by calling 1-800-338-2382 or visiting the VICP website at www.hrsa.gov/vaccinecompensation. HOW CAN I LEARN MORE?  Ask your doctor.  Call your local or state health department.  Contact the Centers for Disease Control and Prevention (CDC):  Call 1-800-232-4636 or visit CDC's website at www.cdc.gov/vaccines CDC Tdap Vaccine VIS (02/21/12) Document Released: 04/01/2012 Document Reviewed: 04/01/2012 ExitCare Patient Information 2013 [employer].  Shingles Vaccine What You Need to Know WHAT IS SHINGLES?  Shingles is a painful skin rash, often with blisters. It is also called Herpes Zoster or just Zoster.  A shingles rash usually appears on one side of the face or body and lasts from 2 to 4 weeks. Its main symptom is pain, which can be quite severe. Other symptoms of   shingles can include fever, headache, chills, and upset stomach. Very rarely, a shingles infection can lead to pneumonia, hearing problems, blindness, brain inflammation (encephalitis), or death.  For about 1 person in 5, severe pain can continue even after the rash clears up. This is called post-herpetic neuralgia.  Shingles is caused by the Varicella Zoster virus. This is the same virus that causes chickenpox. Only someone who has had a case of chickenpox or rarely, has gotten chickenpox vaccine, can get shingles. The virus stays in your body. It can reappear many years later to cause a case of shingles.  You cannot catch shingles from another person with shingles. However, a person who has never had chickenpox (or chickenpox vaccine) could get  chickenpox from someone with shingles. This is not very common.  Shingles is far more common in people 50 and older than in younger people. It is also more common in people whose immune systems are weakened because of a disease such as cancer or drugs such as steroids or chemotherapy.  At least 1 million people get shingles per year in the [place]. SHINGLES VACCINE  A vaccine for shingles was licensed in 2006. In clinical trials, the vaccine reduced the risk of shingles by 50%. It can also reduce the pain in people who still get shingles after being vaccinated.  A single dose of shingles vaccine is recommended for adults 60 years of age and older. SOME PEOPLE SHOULD NOT GET SHINGLES VACCINE OR SHOULD WAIT A person should not get shingles vaccine if he or she:  Has ever had a life-threatening allergic reaction to gelatin, the antibiotic neomycin, or any other component of shingles vaccine. Tell your caregiver if you have any severe allergies.  Has a weakened immune system because of current:  AIDS or another disease that affects the immune system.  Treatment with drugs that affect the immune system, such as prolonged use of high-dose steroids.  Cancer treatment, such as radiation or chemotherapy.  Cancer affecting the bone marrow or lymphatic system, such as leukemia or lymphoma.  Is pregnant, or might be pregnant. Women should not become pregnant until at least 4 weeks after getting shingles vaccine. Someone with a minor illness, such as a cold, may be vaccinated. Anyone with a moderate or severe acute illness should usually wait until he or she recovers before getting the vaccine. This includes anyone with a temperature of 101.3 F (38 C) or higher. WHAT ARE THE RISKS FROM SHINGLES VACCINE?  A vaccine, like any medicine, could possibly cause serious problems, such as severe allergic reactions. However, the risk of a vaccine causing serious harm, or death, is extremely  small.  No serious problems have been identified with shingles vaccine. Mild Problems  Redness, soreness, swelling, or itching at the site of the injection (about 1 person in 3).  Headache (about 1 person in 70). Like all vaccines, shingles vaccine is being closely monitored for unusual or severe problems. WHAT IF THERE IS A MODERATE OR SEVERE REACTION? What should I look for? Any unusual condition, such as a severe allergic reaction or a high fever. If a severe allergic reaction occurred, it would be within a few minutes to an hour after the shot. Signs of a serious allergic reaction can include difficulty breathing, weakness, hoarseness or wheezing, a fast heartbeat, hives, dizziness, paleness, or swelling of the throat. What should I do?  Call your caregiver, or get the person to a caregiver right away.  Tell the caregiver what happened,   the date and time it happened, and when the vaccination was given.  Ask the caregiver to report the reaction by filing a Vaccine Adverse Event Reporting System (VAERS) form. Or, you can file this report through the VAERS web site at www.vaers.hhs.gov or by calling 1-800-822-7967. VAERS does not provide medical advice. HOW CAN I LEARN MORE?  Ask your caregiver. He or she can give you the vaccine package insert or suggest other sources of information.  Contact the Centers for Disease Control and Prevention (CDC):  Call 1-800-232-4636 (1-800-CDC-INFO).  Visit the CDC website at www.cdc.gov/vaccines CDC Shingles Vaccine VIS (07/20/08) Document Released: 07/29/2006 Document Revised: 12/24/2011 Document Reviewed: 07/20/2008 ExitCare Patient Information 2013 [employer].   

## 2013-03-04 ENCOUNTER — Encounter: Payer: Self-pay | Admitting: Gynecology

## 2013-03-11 ENCOUNTER — Ambulatory Visit (INDEPENDENT_AMBULATORY_CARE_PROVIDER_SITE_OTHER): Payer: Medicare Other | Admitting: Gynecology

## 2013-03-11 ENCOUNTER — Ambulatory Visit (INDEPENDENT_AMBULATORY_CARE_PROVIDER_SITE_OTHER): Payer: Medicare Other

## 2013-03-11 DIAGNOSIS — N83339 Acquired atrophy of ovary and fallopian tube, unspecified side: Secondary | ICD-10-CM

## 2013-03-11 DIAGNOSIS — D251 Intramural leiomyoma of uterus: Secondary | ICD-10-CM

## 2013-03-11 DIAGNOSIS — Z853 Personal history of malignant neoplasm of breast: Secondary | ICD-10-CM

## 2013-03-11 DIAGNOSIS — D259 Leiomyoma of uterus, unspecified: Secondary | ICD-10-CM

## 2013-03-11 NOTE — Progress Notes (Signed)
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  Today's ultrasound demonstrated following: Uterus measured 5.7 x 3.7 x 2.8 cm with endometrial stripe of 2.7 mm. Patient had an anterior intramural fibroid measuring 8 x 7 mm anterior calcification measuring 3 mm. Endometrium was thin echogenic and avascular. Right ovary located posterior wall of the uterus was atrophic left ovary atrophic with microcalcification otherwise normal ovaries.  Patient is in the process of scheduling her bone density study. We will see her back in one year or when necessary.

## 2013-03-12 ENCOUNTER — Telehealth: Payer: Self-pay

## 2013-03-12 MED ORDER — LORAZEPAM 0.5 MG PO TABS: 0.5000 mg | ORAL_TABLET | Freq: Three times a day (TID) | ORAL | Status: DC

## 2013-03-12 NOTE — Telephone Encounter (Signed)
Ok, please call in 61.

## 2013-03-12 NOTE — Telephone Encounter (Signed)
Patient said she meant to ask you yesterday at office visit if you would be willing to call in her Lorazepam 0.5mg  for her.  She only has one left to take tonight and then will be out. She said "until I can see my cancer doctor".

## 2013-03-12 NOTE — Telephone Encounter (Signed)
Phoned into pharmacy. Patient notified.

## 2013-03-16 ENCOUNTER — Encounter: Payer: Self-pay | Admitting: Gynecology

## 2013-06-26 ENCOUNTER — Other Ambulatory Visit: Payer: Self-pay | Admitting: Internal Medicine

## 2013-09-02 ENCOUNTER — Ambulatory Visit (INDEPENDENT_AMBULATORY_CARE_PROVIDER_SITE_OTHER): Payer: Medicare Other | Admitting: Gynecology

## 2013-09-02 ENCOUNTER — Encounter: Payer: Self-pay | Admitting: Gynecology

## 2013-09-02 DIAGNOSIS — L739 Follicular disorder, unspecified: Secondary | ICD-10-CM

## 2013-09-02 DIAGNOSIS — L738 Other specified follicular disorders: Secondary | ICD-10-CM

## 2013-09-02 MED ORDER — DOXYCYCLINE HYCLATE 100 MG PO CAPS: ORAL_CAPSULE | ORAL | Status: DC

## 2013-09-02 MED ORDER — MUPIROCIN 2 % EX OINT: 1.0000 "application " | TOPICAL_OINTMENT | Freq: Three times a day (TID) | CUTANEOUS | Status: DC

## 2013-09-02 MED ORDER — FLUCONAZOLE 150 MG PO TABS: 150.0000 mg | ORAL_TABLET | Freq: Once | ORAL | Status: DC

## 2013-09-02 NOTE — Patient Instructions (Signed)
Folliculitis  Folliculitis is redness, soreness, and swelling (inflammation) of the hair follicles. This condition can occur anywhere on the body. People with weakened immune systems, diabetes, or obesity have a greater risk of getting folliculitis. CAUSES  Bacterial infection. This is the most common cause.  Fungal infection.  Viral infection.  Contact with certain chemicals, especially oils and tars. Long-term folliculitis can result from bacteria that live in the nostrils. The bacteria may trigger multiple outbreaks of folliculitis over time. SYMPTOMS Folliculitis most commonly occurs on the scalp, thighs, legs, back, buttocks, and areas where hair is shaved frequently. An early sign of folliculitis is a small, white or yellow, pus-filled, itchy lesion (pustule). These lesions appear on a red, inflamed follicle. They are usually less than 0.2 inches (5 mm) wide. When there is an infection of the follicle that goes deeper, it becomes a boil or furuncle. A group of closely packed boils creates a larger lesion (carbuncle). Carbuncles tend to occur in hairy, sweaty areas of the body. DIAGNOSIS  Your caregiver can usually tell what is wrong by doing a physical exam. A sample may be taken from one of the lesions and tested in a lab. This can help determine what is causing your folliculitis. TREATMENT  Treatment may include:  Applying warm compresses to the affected areas.  Taking antibiotic medicines orally or applying them to the skin.  Draining the lesions if they contain a large amount of pus or fluid.  Laser hair removal for cases of long-lasting folliculitis. This helps to prevent regrowth of the hair. HOME CARE INSTRUCTIONS  Apply warm compresses to the affected areas as directed by your caregiver.  If antibiotics are prescribed, take them as directed. Finish them even if you start to feel better.  You may take over-the-counter medicines to relieve itching.  Do not shave  irritated skin.  Follow up with your caregiver as directed. SEEK IMMEDIATE MEDICAL CARE IF:   You have increasing redness, swelling, or pain in the affected area.  You have a fever. MAKE SURE YOU:  Understand these instructions.  Will watch your condition.  Will get help right away if you are not doing well or get worse. Document Released: 12/10/2001 Document Revised: 04/01/2012 Document Reviewed: 01/01/2012 ExitCare Patient Information 2014 [employer].  

## 2013-09-02 NOTE — Progress Notes (Signed)
The patient presented to the office today stating that for the past 2 days she noted an irritated area in her external genitalia. Patient has not been sexually active in over 15 years. Patient denied any vaginal discharge. She thought there was an infected hair follicle.  Exam: Physical Exam  Genitourinary:      It appears that patient has an ulcerated/folliculitis type lesion. She will be treated with a wide spectrum antibiotic to cover for potential MRSA infection. She will be placed on Vibramycin 100 mg twice a day for 2 weeks. She will wash the area with antibacterial soap daily and apply Bactroban antibiotic cream 2-3 times a day. In the area does not resolve after 2 weeks she will return back to the office for biopsy. She had no inguinal lymphadenopathy and no other abnormalities were noted on vaginal pelvic exam.

## 2013-12-07 ENCOUNTER — Telehealth: Payer: Self-pay | Admitting: *Deleted

## 2013-12-07 IMAGING — US US ABDOMEN COMPLETE
1 series · 14 of 25 positions shown · non-contrast
Comparison: None.

CLINICAL DATA: Epigastric and mid abdominal pain.

ABDOMINAL ULTRASOUND COMPLETE

[Series 1: us abdomen complete · 14 of 70 slices shown]
[im 1/70]
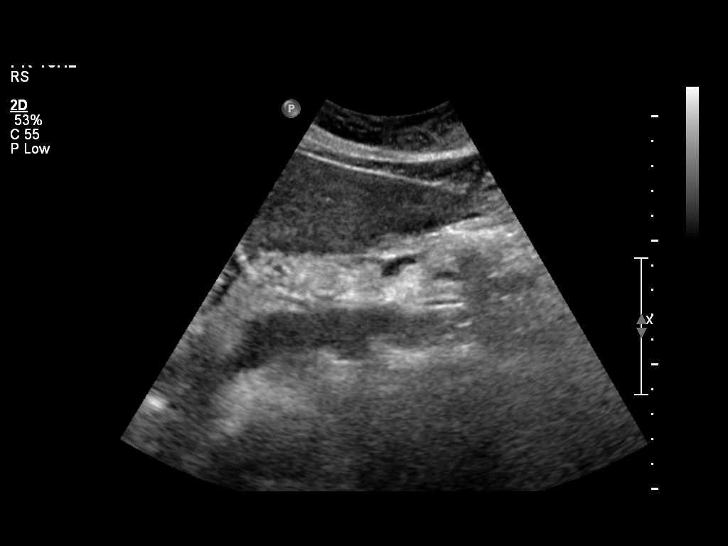
[im 6/70]
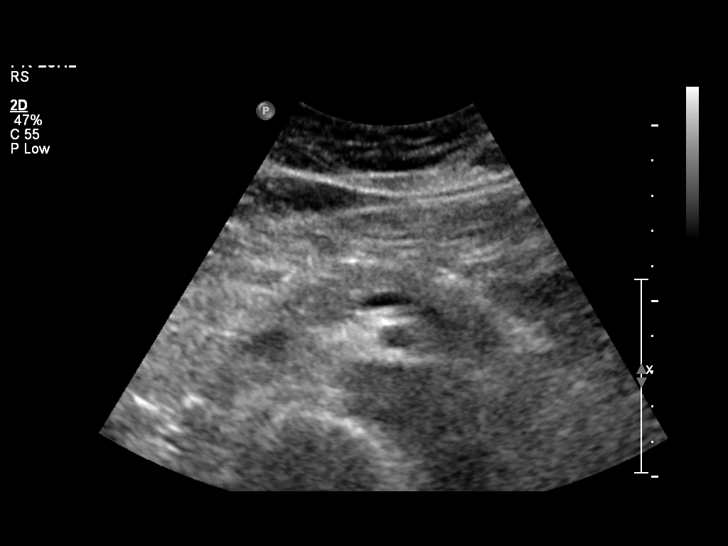
[im 12/70]
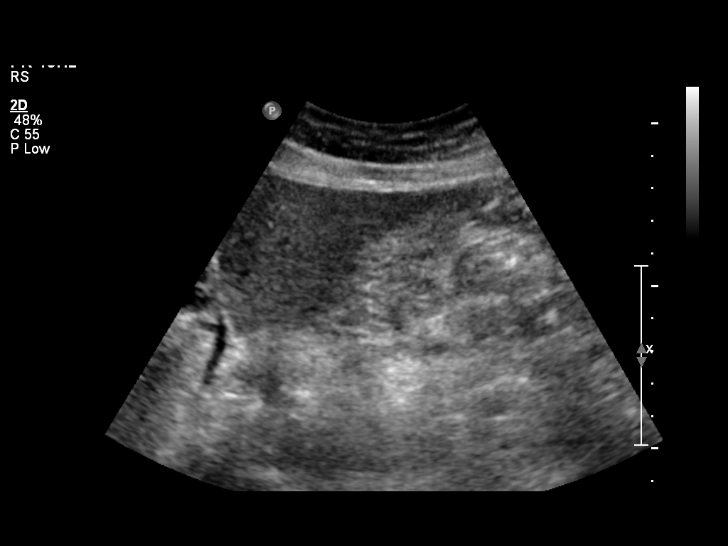
[im 18/70]
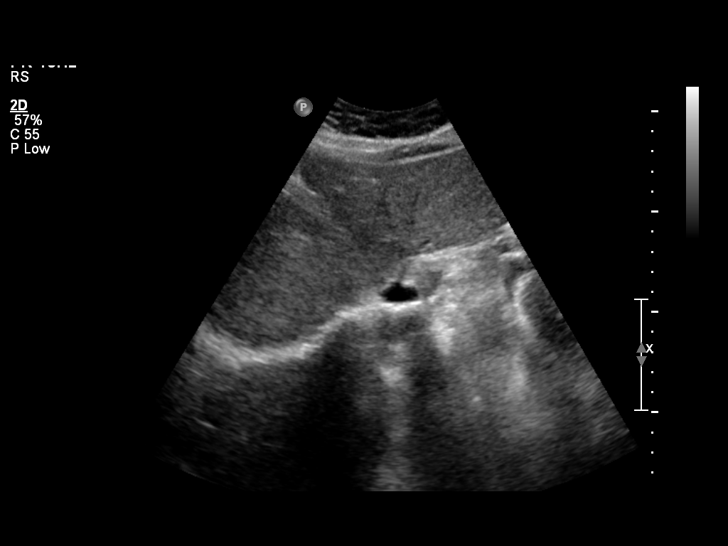
[im 24/70]
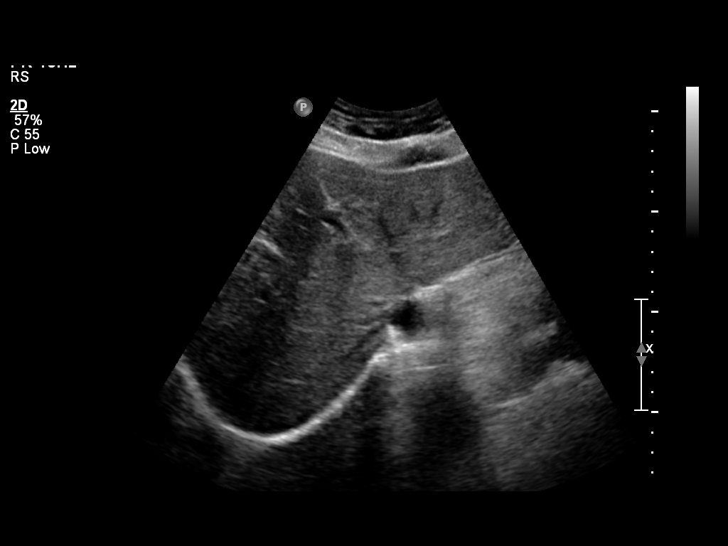
[im 26/70]
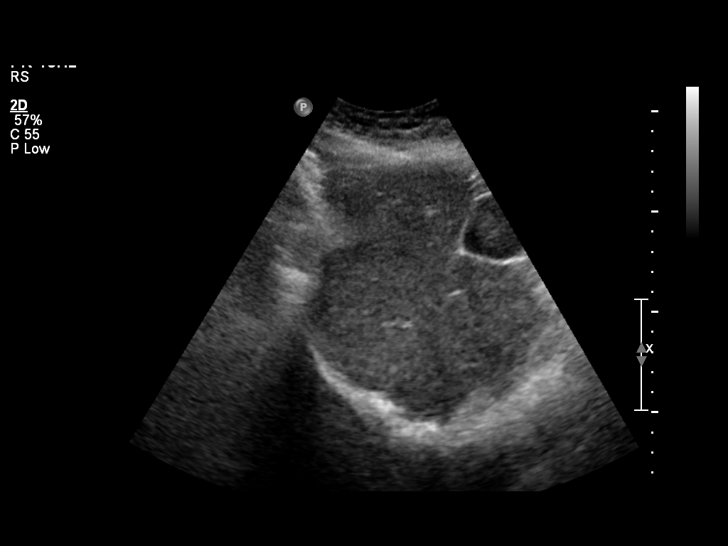
[im 32/70]
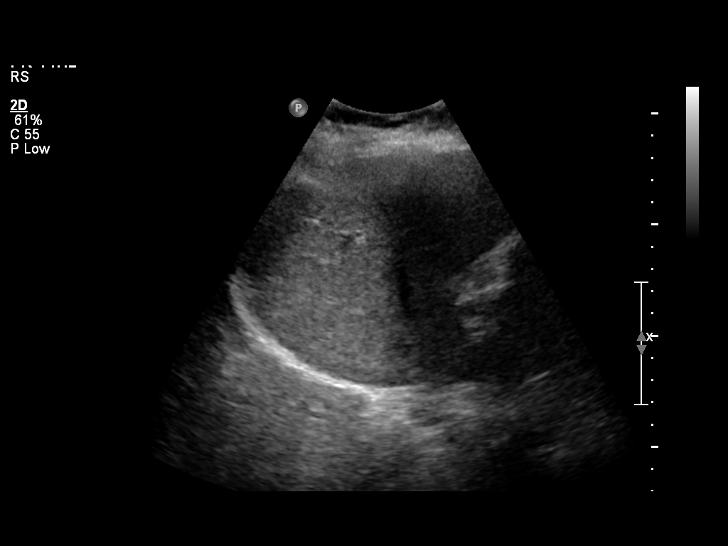
[im 38/70]
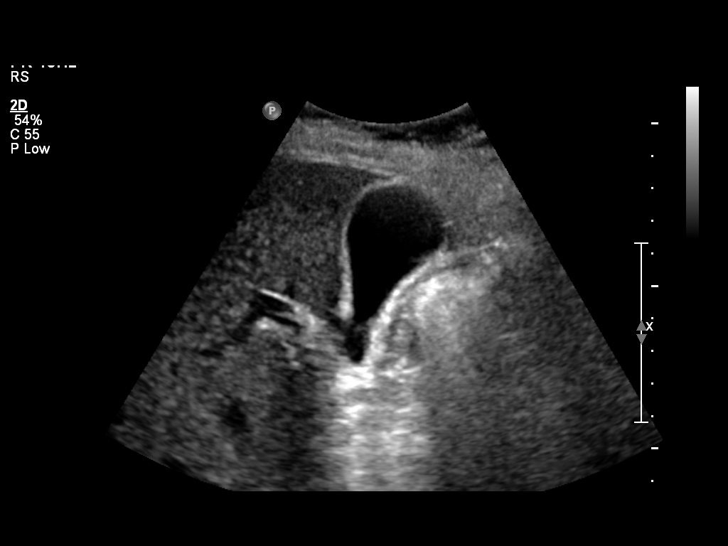
[im 44/70]
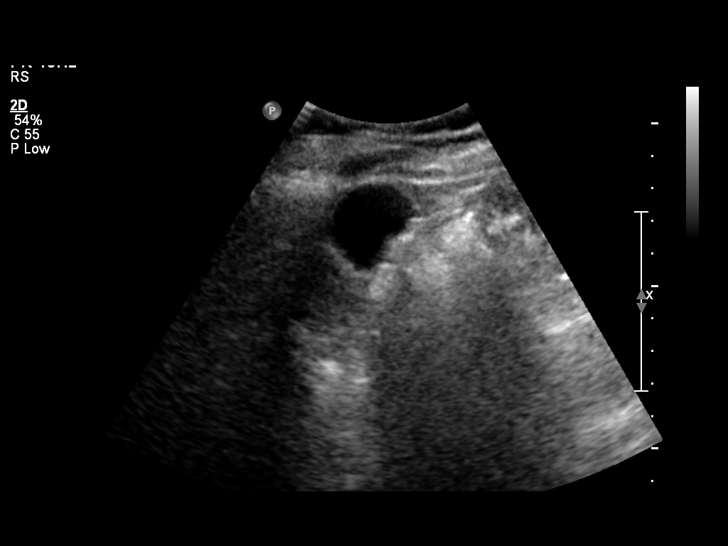
[im 47/70]
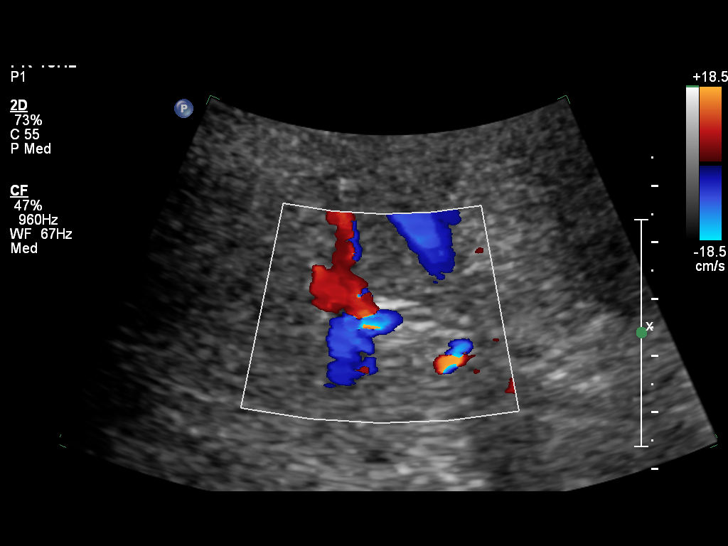
[im 52/70]
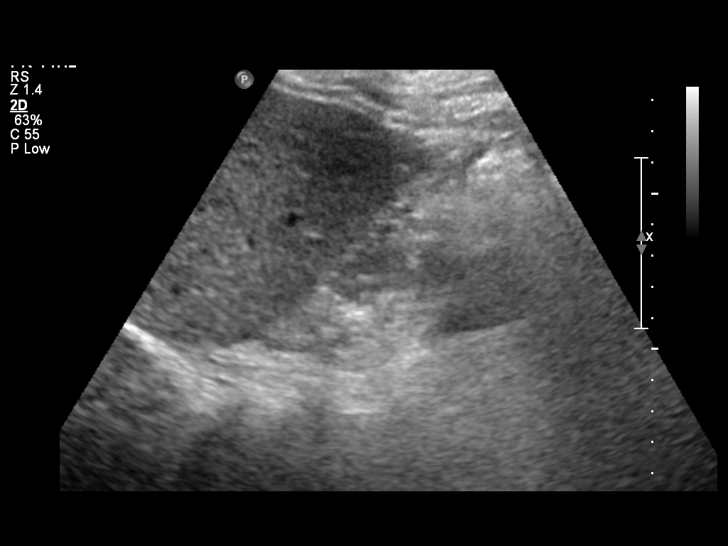
[im 58/70]
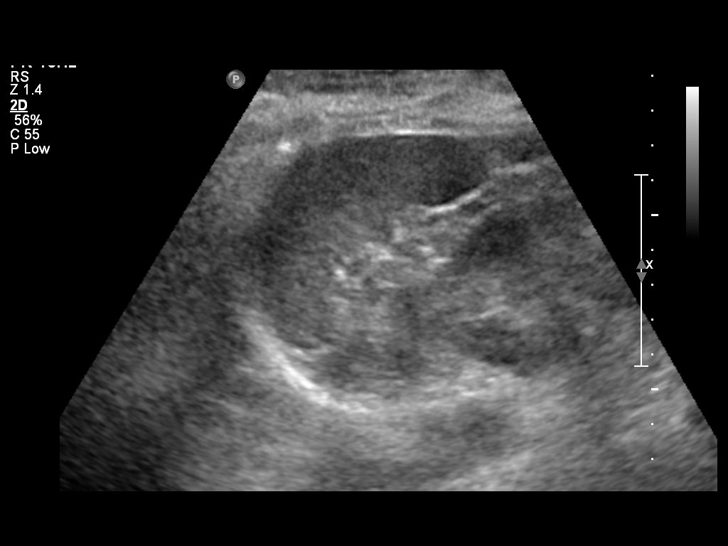
[im 64/70]
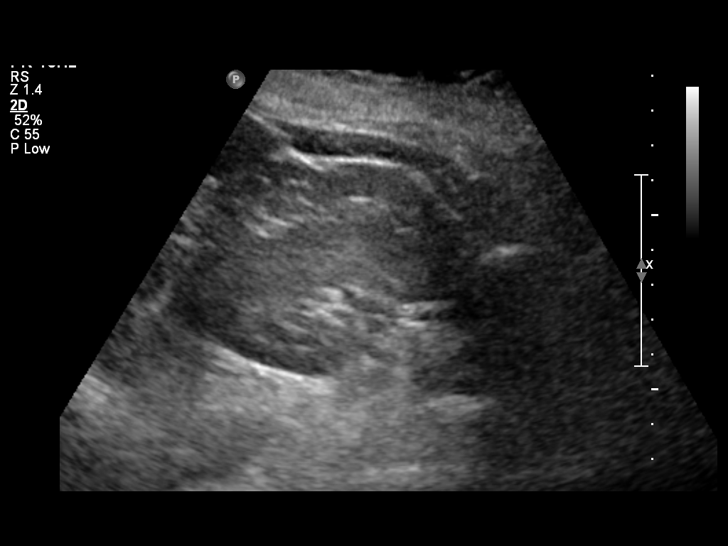
[im 70/70]
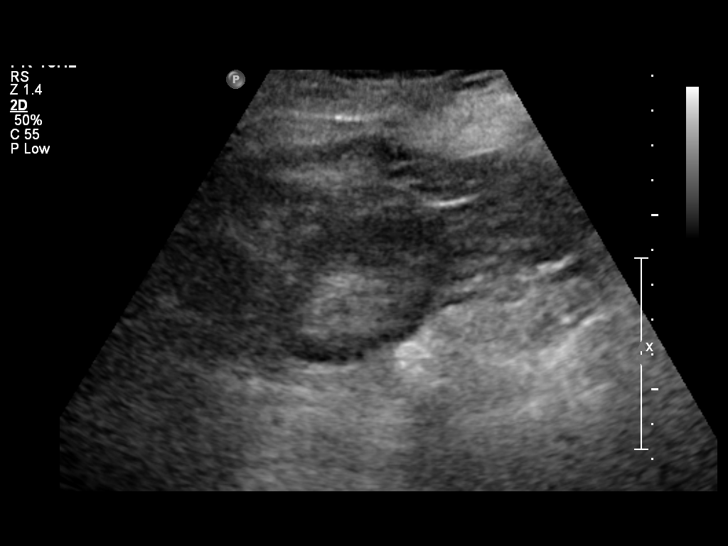

[14 of 25 positions shown; findings below may reference images not displayed]

FINDINGS: Gallbladder:  No gallstones, gallbladder wall thickening, or
pericholecystic fluid.

Common Bile Duct:  Within normal limits in caliber. Measures 3 mm
in diameter.

Liver: No focal mass lesion identified.  Within normal limits in
parenchymal echogenicity.

IVC:  Appears normal.

Pancreas:  No abnormality identified.

Spleen:  Within normal limits in size and echotexture.

Right kidney:  Small size measuring 8.8 cm in length. Normal
parenchymal echogenicity.  Matusal evidence of renal mass or
hydronephrosis.

Left kidney:  Normal in size , measuring 12.0 cm in length.
Normalparenchymal echogenicity.  No evidence of mass or
hydronephrosis.

Abdominal Aorta:  No aneurysm identified.
IMPRESSION: 1.  No evidence of gallstones, hydronephrosis, or other acute
findings.
2.  Small right kidney incidentally noted, which is nonspecific and
likely due to chronic insult such as prior infection or renal
artery stenosis.

## 2013-12-07 NOTE — Telephone Encounter (Signed)
Referral faxed to [facility] office will wait for response.

## 2013-12-07 NOTE — Telephone Encounter (Signed)
Pt called requesting a referral to rheumatology [place] for her rheumatoid arthritis, pt said she spoke with you about this at last visit. Okay to make referral? She also would like the name of ENT MD as well. Please advise

## 2013-12-07 NOTE — Telephone Encounter (Signed)
Dr. Bronson Curb Rheumatology Dr. Clois Comber ENT

## 2013-12-09 NOTE — Telephone Encounter (Signed)
Pt will contact [facility] regarding the below.

## 2013-12-09 NOTE — Telephone Encounter (Signed)
[facility] from [facility] office called requesting records from other rheumatology. I called and left message on pt voicemail this records were needed to schedule.

## 2013-12-22 ENCOUNTER — Encounter: Payer: Self-pay | Admitting: *Deleted

## 2014-01-13 ENCOUNTER — Ambulatory Visit (INDEPENDENT_AMBULATORY_CARE_PROVIDER_SITE_OTHER): Payer: Medicare Other | Admitting: Internal Medicine

## 2014-01-13 ENCOUNTER — Encounter: Payer: Self-pay | Admitting: Internal Medicine

## 2014-01-13 VITALS — BP 110/68 | HR 60 | Ht 61.0 in | Wt 196.0 lb

## 2014-01-13 DIAGNOSIS — R1013 Epigastric pain: Secondary | ICD-10-CM

## 2014-01-13 DIAGNOSIS — K219 Gastro-esophageal reflux disease without esophagitis: Secondary | ICD-10-CM

## 2014-01-13 DIAGNOSIS — K3189 Other diseases of stomach and duodenum: Secondary | ICD-10-CM

## 2014-01-13 MED ORDER — DICLOFENAC SODIUM 75 MG PO TBEC: 75.0000 mg | DELAYED_RELEASE_TABLET | Freq: Every day | ORAL | Status: DC

## 2014-01-13 MED ORDER — LORAZEPAM 0.5 MG PO TABS: 0.5000 mg | ORAL_TABLET | Freq: Three times a day (TID) | ORAL | Status: DC | PRN

## 2014-01-13 MED ORDER — ESOMEPRAZOLE MAGNESIUM 40 MG PO CPDR: 40.0000 mg | DELAYED_RELEASE_CAPSULE | Freq: Every day | ORAL | Status: DC

## 2014-01-13 MED ORDER — LORAZEPAM 0.5 MG PO TABS: 0.5000 mg | ORAL_TABLET | Freq: Three times a day (TID) | ORAL | Status: AC | PRN

## 2014-01-13 NOTE — Progress Notes (Signed)
Kelly Pennington 02-25-1946 [id number]  Note: This dictation was prepared with Dragon digital system. Any transcriptional errors that result from this procedure are unintentional.   History of Present Illness:  This is a 68 year old African American female with a history of gastroesophageal reflux disease and epigastric pain. We saw her in July 2013 for epigastric pain which was attributed to Ibuprofen.. She has had steroid dependent  PMR followed by Dr. Marijean Bravo. She has been on a prednisone taper. Currently, she is on 0.5 mg daily. We did a prior upper endoscopy in August 2013 which showed a 3 cm hiatal hernia and clo negative gastritis. Prior colonoscopies in 2000, 2006 and in August 2013 were normal. Her husband passed away 4 years ago and she has been under a lot of stress. She also has been taking Aleve and ibuprofen for back pain. Her symptoms are mostly abdominal pain, nausea, food intolerance and belching. She drinks only one cup of coffee a day. She does not smoke and does not drink alcohol. Her weight has been excessive but she has been trying to lose weight. An upper abdominal ultrasound in April 2013 showed a normal gallbladder and small right kidney.    Past Medical History  Diagnosis Date  . NSVD (normal spontaneous vaginal delivery)     X2  . SAB (spontaneous abortion)     X2  . Hypertension   . Depression   . GERD (gastroesophageal reflux disease)   . Osteopenia   . Polymyalgia rheumatica   . Intraductal carcinoma of breast 2006    left  . Arthritis   . Internal hemorrhoids   . Hiatal hernia   . Gastritis     Past Surgical History  Procedure Laterality Date  . Cervical cone biopsy    . Breast lumpectomy    . Bunionectomy      LEFT  . Carpal tunnel release      BILATERAL  . Dilation and curettage of uterus    . Spontaneous abortion      x 2  . Foot surgery Right     Allergies  Allergen Reactions  . Other     SHELL FISH  . Penicillins     Family history  and social history have been reviewed.  Review of Systems: Negative for dysphagia. Positive for heartburn and food intolerance  The remainder of the 10 point ROS is negative except as outlined in the H&P  Physical Exam: General Appearance Well developed, in no distress Eyes  Non icteric  HEENT  Non traumatic, normocephalic  Mouth No lesion, tongue papillated, no cheilosis Neck Supple without adenopathy, thyroid not enlarged, no carotid bruits, no JVD Lungs Clear to auscultation bilaterally COR Normal S1, normal S2, regular rhythm, no murmur, quiet precordium Abdomen obese soft with normal active bowel sounds. No tenderness. Liver edge at costal margin. Lower abdomen is unremarkable Rectal not done Extremities  No pedal edema, post left hammer toe surgery Skin No lesions Neurological Alert and oriented x 3 Psychological Normal mood and affect  Assessment and Plan:   Problem #1 Exacerbation of dyspepsia and bloating likely due to Motrin and Aleve. Stress also seems to be a contributing factor. Her weight gain is a contributing factor for reflux.Marland Kitchen She was unable to afford Nexium last year but has a different insurance  this year, so we will call in Nexium 40 mg every morning and she will continue to take OTC ranitidine 150 mg at bedtime. I have instructed her in antireflux measures  and will start her on Voltaren 75 mg daily in place of Aleve and Motrin. We will refill Ativan 0.5 mg for stress management. If Nexium too expensive the will try Prilosec to 40 mg twice a day    Delfin Edis 01/13/2014

## 2014-01-13 NOTE — Patient Instructions (Addendum)
We have sent the following medications to your pharmacy for you to pick up at your convenience: Nexium Voltaren Ativan  You may continue taking ranitidine OTC at the end of the day. Dr Clayburn Pert, Dr Marijean Bravo

## 2014-04-08 ENCOUNTER — Ambulatory Visit (HOSPITAL_BASED_OUTPATIENT_CLINIC_OR_DEPARTMENT_OTHER): Admission: RE | Admit: 2014-04-08 | Payer: Medicare Other | Source: Ambulatory Visit | Admitting: Orthopedic Surgery

## 2014-04-08 ENCOUNTER — Encounter (HOSPITAL_BASED_OUTPATIENT_CLINIC_OR_DEPARTMENT_OTHER): Admission: RE | Payer: Self-pay | Source: Ambulatory Visit

## 2014-04-08 SURGERY — SYNOVECTOMY
Anesthesia: General | Laterality: Left

## 2014-07-21 ENCOUNTER — Encounter: Payer: Self-pay | Admitting: Gynecology

## 2014-07-21 ENCOUNTER — Ambulatory Visit (INDEPENDENT_AMBULATORY_CARE_PROVIDER_SITE_OTHER): Payer: Medicare Other | Admitting: Gynecology

## 2014-07-21 VITALS — BP 124/78 | Ht 61.0 in | Wt 188.0 lb

## 2014-07-21 DIAGNOSIS — Z23 Encounter for immunization: Secondary | ICD-10-CM

## 2014-07-21 DIAGNOSIS — R350 Frequency of micturition: Secondary | ICD-10-CM

## 2014-07-21 DIAGNOSIS — N952 Postmenopausal atrophic vaginitis: Secondary | ICD-10-CM

## 2014-07-21 DIAGNOSIS — Z780 Asymptomatic menopausal state: Secondary | ICD-10-CM

## 2014-07-21 DIAGNOSIS — M8580 Other specified disorders of bone density and structure, unspecified site: Secondary | ICD-10-CM

## 2014-07-21 DIAGNOSIS — Z853 Personal history of malignant neoplasm of breast: Secondary | ICD-10-CM

## 2014-07-21 LAB — URINALYSIS W MICROSCOPIC + REFLEX CULTURE
Bilirubin Urine: NEGATIVE
Crystals: NONE SEEN
Glucose, UA: NEGATIVE mg/dL
KETONES UR: NEGATIVE mg/dL
Leukocytes, UA: NEGATIVE
NITRITE: NEGATIVE
Protein, ur: NEGATIVE mg/dL
Specific Gravity, Urine: 1.02 (ref 1.005–1.030)
UROBILINOGEN UA: 0.2 mg/dL (ref 0.0–1.0)
pH: 6.5 (ref 5.0–8.0)

## 2014-07-21 NOTE — Progress Notes (Signed)
Kelly Pennington Aug 24, 1946 [id number]   History:    68 y.o.  for annual gyn exam with no complaints. 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 In May of 2005 patient had Quadrentectomy and sentinel lymph node biopsy; 2) Chemotherapy, AC x 4, Taxotere and Herceptin completed in 04/2005; 3) 10/17/2004: The patient completed adjuvant radiotherapy to the whole breast. 4860 cGy plus a boost of 1200 cGy in six fractions.Chemotherapy was administered at the [facility] [place] in [place] per [facility] protocol [phone]. She has now completed all required sample collections. Per protocol, she has been followed annually for 10 more years by medical oncologist at [facility]. She was seen this summer by an in and also had her mammogram which was benign.  She's also been followed locally for her rheumatoid arthritis for which she is now on methotrexate but was on chronic prednisone 5 mg daily.In April of 2012 her bone mineral density had demonstrated a significant decrease in bone mineral density by -4.5% at the [facility]. Her T score was -1.5 (osteopenia). Her bone density this year was reported to be normal. She is taking her calcium and vitamin D daily. She had a normal colonoscopy in 2014 and reports no prior history of colon polyps. She was grew for hepatitis C last year and was negative. Her brother had hepatitis C and died of liver cancer.  Patient received the Tdap vaccine last year and shingles vaccine. Patient requesting flu vaccine today.  Because of a history of breast cancer and limited pelvic exam we have been doing an abdominal ultrasound. The ultrasound in May 2014 demonstrated the following: Uterus measured 5.7 x 3.7 x 2.8 cm with endometrial stripe of 2.7 mm. Patient had an anterior intramural fibroid measuring 8 x 7 mm anterior calcification measuring 3 mm. Endometrium was thin echogenic and avascular. Right  ovary located posterior wall of the uterus was atrophic left ovary atrophic with microcalcification otherwise normal ovaries.  Her PCP has been good her blood work.  Past medical history,surgical history, family history and social history were all reviewed and documented in the EPIC chart.  Gynecologic History No LMP recorded. Patient is postmenopausal. Contraception: post menopausal status Last Pap: 2012. Results were: normal Last mammogram: 2015. Results were: normal  Obstetric History OB History  Gravida Para Term Preterm AB SAB TAB Ectopic Multiple Living  _0 # Outcome Date GA Lbr Len/2nd Weight Sex Delivery Anes PTL Lv  4 SAB           3 SAB           2 TRM     F SVD  N Y  1 TRM     F SVD  N Y       ROS: A ROS was performed and pertinent positives and negatives are included in the history.  GENERAL: No fevers or chills. HEENT: No change in vision, no earache, sore throat or sinus congestion. NECK: No pain or stiffness. CARDIOVASCULAR: No chest pain or pressure. No palpitations. PULMONARY: No shortness of breath, cough or wheeze. GASTROINTESTINAL: No abdominal pain, nausea, vomiting or diarrhea, melena or bright red blood per rectum. GENITOURINARY: No urinary frequency, urgency, hesitancy or dysuria. MUSCULOSKELETAL: No joint or muscle pain, no back pain, no recent trauma. DERMATOLOGIC: No rash, no itching, no lesions. ENDOCRINE: No polyuria, polydipsia, no  heat or cold intolerance. No recent change in weight. HEMATOLOGICAL: No anemia or easy bruising or bleeding. NEUROLOGIC: No headache, seizures, numbness, tingling or weakness. PSYCHIATRIC: No depression, no loss of interest in normal activity or change in sleep pattern.     Exam: chaperone present  BP 124/78  Ht _0  (1.549 m)  Wt 188 lb (85.276 kg)  BMI 35.54 kg/m2  Body mass index is 35.54 kg/(m^2).  General appearance : Well developed well nourished female. No acute distress HEENT: Neck supple,  trachea midline, no carotid bruits, no thyroidmegaly Lungs: Clear to auscultation, no rhonchi or wheezes, or rib retractions  Heart: Regular rate and rhythm, no murmurs or gallops Breast:Examined in sitting and supine position were symmetrical in appearance, no palpable masses or tenderness,  no skin retraction, no nipple inversion, no nipple discharge, no skin discoloration, no axillary or supraclavicular lymphadenopathy Abdomen: no palpable masses or tenderness, no rebound or guarding Extremities: no edema or skin discoloration or tenderness  Pelvic:  Bartholin, Urethra, Skene Glands: Within normal limits             Vagina: No gross lesions or discharge, vaginal atrophy Cervix: No gross lesions or discharge  Uterus  anteverted, normal size, shape and consistency, non-tender and mobile  Adnexa  Without masses or tenderness  Anus and perineum  normal   Rectovaginal  normal sphincter tone without palpated masses or tenderness             Hemoccult PCP Dr. Criss Rosales will provide     Assessment/Plan:  68 y.o. female for annual exam with has history of breast cancer as noted above doing well. Patient will return back to the office in 1-2 weeks for pelvic ultrasound for better assessment of her ovaries. Patient denies any vaginal bleeding. Pap smear no longer needed according to the new guidelines. Patient received the flu vaccine today. We discussed importance of calcium vitamin D and regular exercise for osteoporosis prevention. PCP has been doing her blood work as well as the Futures trader.   Terrance Mass MD, 12:34 PM 07/21/2014

## 2014-07-21 NOTE — Patient Instructions (Signed)

## 2014-07-21 NOTE — Addendum Note (Signed)
Addended by: Thurnell Garbe A on: 07/21/2014 03:39 PM   Modules accepted: Orders

## 2014-07-22 LAB — URINE CULTURE
Colony Count: NO GROWTH
ORGANISM ID, BACTERIA: NO GROWTH

## 2014-08-09 ENCOUNTER — Ambulatory Visit (INDEPENDENT_AMBULATORY_CARE_PROVIDER_SITE_OTHER): Payer: Medicare Other | Admitting: Gynecology

## 2014-08-09 ENCOUNTER — Other Ambulatory Visit: Payer: Self-pay | Admitting: Gynecology

## 2014-08-09 ENCOUNTER — Ambulatory Visit (INDEPENDENT_AMBULATORY_CARE_PROVIDER_SITE_OTHER): Payer: Medicare Other

## 2014-08-09 DIAGNOSIS — N83319 Acquired atrophy of ovary, unspecified side: Secondary | ICD-10-CM

## 2014-08-09 DIAGNOSIS — Z853 Personal history of malignant neoplasm of breast: Secondary | ICD-10-CM

## 2014-08-09 DIAGNOSIS — D251 Intramural leiomyoma of uterus: Secondary | ICD-10-CM

## 2014-08-09 DIAGNOSIS — N8331 Acquired atrophy of ovary: Secondary | ICD-10-CM

## 2014-08-09 NOTE — Progress Notes (Signed)
   Patient presented to the office today for ultrasound for assessment of her ovaries and fibroid. 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 In May of 2005 patient had Quadrentectomy and sentinel lymph node biopsy; 2) Chemotherapy, AC x 4, Taxotere and Herceptin completed in 04/2005; 3) 10/17/2004: The patient completed adjuvant radiotherapy to the whole breast. 4860 cGy plus a boost of 1200 cGy in six fractions.Chemotherapy was administered at the [facility] [place] in [place] per CAL[facility] protocol [phone]. She has now completed all required sample collections. Per protocol, she has been followed annually for 10 more years by medical oncologist at [facility]. She was seen this summer by an in and also had her mammogram which was benign. Patient with no past history of BRCA1 and BRCA2 testing.  Because of a history of breast cancer and limited pelvic exam we have been doing an abdominal ultrasound. The ultrasound in May 2014 demonstrated the following:  Uterus measured 5.7 x 3.7 x 2.8 cm with endometrial stripe of 2.7 mm. Patient had an anterior intramural fibroid measuring 8 x 7 mm anterior calcification measuring 3 mm. Endometrium was thin echogenic and avascular. Right ovary located posterior wall of the uterus was atrophic left ovary atrophic with microcalcification otherwise normal ovaries.    ultrasound today: Uterus measures 6.1 x 3.8 x 2.7 cm with endometrial stripe at 2.1 mm. A small intramural fibroid 9 x 8 mm essentially unchanged from last year. Right and left ovary were otherwise normal and atrophic. No apparent adnexal masses and no fluid in the cul-de-sac.  Assessment/plan: Patient with past history of breast cancer. Ultrasound stable small fibroid. Atrophic ovaries. Patient will follow-up in one year for annual gynecological exam or when necessary.

## 2014-08-16 ENCOUNTER — Encounter: Payer: Self-pay | Admitting: Gynecology

## 2014-08-27 ENCOUNTER — Other Ambulatory Visit: Payer: Self-pay | Admitting: Internal Medicine

## 2014-11-12 ENCOUNTER — Telehealth: Payer: Self-pay | Admitting: Internal Medicine

## 2014-11-16 MED ORDER — OMEPRAZOLE 40 MG PO CPDR: 40.0000 mg | DELAYED_RELEASE_CAPSULE | Freq: Two times a day (BID) | ORAL | Status: AC

## 2014-11-16 NOTE — Telephone Encounter (Signed)
Spoke with patient on 11/16/14 and they requested their Rx for Prilosec to be printed out for them to pick up. Printed out Rx for Omeprazole (Prilosec), 40 mg, bid, by mouth. Patient stated they will pick up Rx on 11/17/14.

## 2014-12-15 ENCOUNTER — Ambulatory Visit (INDEPENDENT_AMBULATORY_CARE_PROVIDER_SITE_OTHER): Payer: Medicare Other | Admitting: Women's Health

## 2014-12-15 ENCOUNTER — Encounter: Payer: Self-pay | Admitting: Women's Health

## 2014-12-15 VITALS — BP 124/80 | Ht 60.0 in | Wt 190.0 lb

## 2014-12-15 DIAGNOSIS — R350 Frequency of micturition: Secondary | ICD-10-CM | POA: Diagnosis not present

## 2014-12-15 DIAGNOSIS — N3000 Acute cystitis without hematuria: Secondary | ICD-10-CM | POA: Diagnosis not present

## 2014-12-15 DIAGNOSIS — B373 Candidiasis of vulva and vagina: Secondary | ICD-10-CM

## 2014-12-15 DIAGNOSIS — B3731 Acute candidiasis of vulva and vagina: Secondary | ICD-10-CM

## 2014-12-15 LAB — URINALYSIS W MICROSCOPIC + REFLEX CULTURE
Bilirubin Urine: NEGATIVE
CRYSTALS: NONE SEEN
Casts: NONE SEEN
GLUCOSE, UA: NEGATIVE mg/dL
Ketones, ur: NEGATIVE mg/dL
Nitrite: NEGATIVE
PROTEIN: NEGATIVE mg/dL
Specific Gravity, Urine: 1.015 (ref 1.005–1.030)
UROBILINOGEN UA: 0.2 mg/dL (ref 0.0–1.0)
pH: 7 (ref 5.0–8.0)

## 2014-12-15 MED ORDER — FLUCONAZOLE 150 MG PO TABS: 150.0000 mg | ORAL_TABLET | Freq: Once | ORAL | Status: DC

## 2014-12-15 MED ORDER — CIPROFLOXACIN HCL 250 MG PO TABS: 250.0000 mg | ORAL_TABLET | Freq: Two times a day (BID) | ORAL | Status: DC

## 2014-12-15 NOTE — Patient Instructions (Signed)

## 2014-12-15 NOTE — Progress Notes (Signed)
Patient ID: Kelly Pennington, female   DOB: Feb 13, 1946, 69 y.o.   MRN: [id number] Presents with complaint of urinary frequency with pain and burning at the end of stream for several days. Had been treated for a sinus infection with a Z-Pak 1 week ago. Questionable odor with urination. Denies fever, vaginal discharge or irritation. Not sexually active greater than 15 years/widow.  Exam: Appears well. External genitalia within normal limits, atrophic, speculum exam no visible discharge or erythema. No odor noted.  UA: Trace blood, moderate leukocytes, 11-20 WBCs, few bacteria, yeast.  UTI Yeast vaginitis  Plan: Diflucan 150 by mouth 1 dose, refill given. Cipro 250 twice daily for 3 days. Instructed to call if no relief of symptoms. UTI prevention discussed. Urine culture pending.

## 2014-12-18 LAB — URINE CULTURE

## 2015-01-12 ENCOUNTER — Ambulatory Visit: Payer: Medicare Other | Admitting: Licensed Clinical Social Worker

## 2015-04-01 ENCOUNTER — Encounter: Payer: Self-pay | Admitting: Internal Medicine

## 2015-05-16 ENCOUNTER — Telehealth: Payer: Self-pay | Admitting: Internal Medicine

## 2015-05-16 NOTE — Telephone Encounter (Signed)
Pt states she has been having problems with gerd and she ran out of her meds 2 days ago. Pt states she needs to be seen for refill. Pt scheduled to see Dr. Olevia Perches Wednesday at 3:15pm. Pt aware of appt.

## 2015-05-18 ENCOUNTER — Encounter: Payer: Self-pay | Admitting: Internal Medicine

## 2015-05-18 ENCOUNTER — Ambulatory Visit (INDEPENDENT_AMBULATORY_CARE_PROVIDER_SITE_OTHER): Payer: Medicare Other | Admitting: Internal Medicine

## 2015-05-18 VITALS — BP 84/56 | HR 56 | Ht 60.5 in | Wt 184.1 lb

## 2015-05-18 DIAGNOSIS — K2970 Gastritis, unspecified, without bleeding: Secondary | ICD-10-CM

## 2015-05-18 DIAGNOSIS — K2990 Gastroduodenitis, unspecified, without bleeding: Secondary | ICD-10-CM

## 2015-05-18 DIAGNOSIS — K219 Gastro-esophageal reflux disease without esophagitis: Secondary | ICD-10-CM | POA: Diagnosis not present

## 2015-05-18 MED ORDER — PROMETHAZINE HCL 25 MG PO TABS: 25.0000 mg | ORAL_TABLET | Freq: Four times a day (QID) | ORAL | Status: AC | PRN

## 2015-05-18 MED ORDER — RANITIDINE HCL 150 MG PO TABS: 150.0000 mg | ORAL_TABLET | Freq: Every day | ORAL | Status: AC

## 2015-05-18 MED ORDER — SUCRALFATE 1 G PO TABS: 1.0000 g | ORAL_TABLET | Freq: Two times a day (BID) | ORAL | Status: AC

## 2015-05-18 MED ORDER — PANTOPRAZOLE SODIUM 40 MG PO TBEC: 40.0000 mg | DELAYED_RELEASE_TABLET | Freq: Every day | ORAL | Status: AC

## 2015-05-18 NOTE — Patient Instructions (Addendum)
We have sent  medications to your pharmacy for you to pick up at your convenience. Dr Lucianne Lei

## 2015-05-18 NOTE — Progress Notes (Signed)
Kelly Pennington [birth year] [id number]  Note: This dictation was prepared with Dragon digital system. Any transcriptional errors that result from this procedure are unintentional.   History of Present Illness: This is a 69 year old female with history of gastroesophageal reflux and reflux esophagitis on upper endoscopy in August 2013. Now with recurrence of  nausea ,reflux and burning which she blames on eating a  Beef  sandwich about 2 weeks ago. She has run out off  Prilosec and has not taken any for a while. She denies dysphagia. Upper abdominal ultrasound in April 2013 show normal gallbladder and small right kidney ( had UTI's in the past).. Recall colonoscopy in 2000, 2006 and in August 2013 was normal.    Past Medical History  Diagnosis Date  . NSVD (normal spontaneous vaginal delivery)     X2  . SAB (spontaneous abortion)     X2  . Hypertension   . Depression   . GERD (gastroesophageal reflux disease)   . Osteopenia   . Polymyalgia rheumatica   . Intraductal carcinoma of breast 2006    left  . Arthritis   . Internal hemorrhoids   . Hiatal hernia   . Gastritis     Past Surgical History  Procedure Laterality Date  . Cervical cone biopsy    . Breast lumpectomy    . Bunionectomy      LEFT  . Carpal tunnel release      BILATERAL  . Dilation and curettage of uterus    . Spontaneous abortion      x 2  . Foot surgery Right     Allergies  Allergen Reactions  . Other     SHELL FISH  . Penicillins     Family history and social history have been reviewed.  Review of Systems:   The remainder of the 10 point ROS is negative except as outlined in the H&P  Physical Exam: General Appearance Well developed, in no distress Eyes  Non icteric  HEENT  Non traumatic, normocephalic  Mouth No lesion, tongue papillated, no cheilosis Neck Supple without adenopathy, thyroid not enlarged, no carotid bruits, no JVD Lungs Clear to auscultation bilaterally COR Normal S1, normal S2,  regular rhythm, no murmur, quiet precordium Abdomen tender in epigastrium. Tender in the left and right upper quadrant. Normoactive bowel sounds. No CVA tenderness not done Rectal not done Extremities  No pedal edema Skin No lesions Neurological Alert and oriented x 3 Psychological Normal mood and affect  Assessment and Plan:   69 year old female with the acute gastritis probably due to dietary indiscretion. We will start Protonix 40 mg once a day and Carafate slurry 10 cc by mouth twice a day for week. Phenergan 25 mg, take when necessary nausea,  ranitidine 150 mg by mouth when necessary breakthrough symptoms not controlled by Protonix.    Delfin Edis 05/18/2015

## 2015-05-19 ENCOUNTER — Telehealth: Payer: Self-pay

## 2015-05-19 NOTE — Telephone Encounter (Signed)
OK o switch to Zofran 8 mg, #30, 1 po q8 hrs prn nausea , 1 refill

## 2015-05-19 NOTE — Telephone Encounter (Signed)
Received fax from patient's insurance company stating insurance would not cover promethazine ant it needed a prior authorization. Called and insurance company and they denied coverage promethazine and states generic Zofran is covered and Tier 2 on the patient's plan. Can we switch patient to generic Zofran Dr. Olevia Perches?

## 2015-05-20 MED ORDER — ONDANSETRON HCL 8 MG PO TABS: 8.0000 mg | ORAL_TABLET | Freq: Three times a day (TID) | ORAL | Status: AC | PRN

## 2015-05-20 NOTE — Telephone Encounter (Signed)
Notified patient and new prescription for Zofran sent to her pharmacy.

## 2015-05-23 ENCOUNTER — Telehealth: Payer: Self-pay | Admitting: Internal Medicine

## 2015-05-23 NOTE — Telephone Encounter (Signed)
Left a message for patient to call back. 

## 2015-05-24 NOTE — Telephone Encounter (Signed)
Ok

## 2015-05-24 NOTE — Telephone Encounter (Signed)
Please l;et her see Dr Hilarie Fredrickson, she will need EGD and I am retiring,

## 2015-05-24 NOTE — Telephone Encounter (Signed)
Left a message for patient to call back. 

## 2015-05-24 NOTE — Telephone Encounter (Signed)
Spoke with patient and she states the Protonix, Carafate and Ranitidine are not helping her GERD. Please, advise.

## 2015-05-24 NOTE — Telephone Encounter (Signed)
Request sent to Dr. Hilarie Fredrickson to review records re: accepting patient.

## 2015-05-24 NOTE — Telephone Encounter (Signed)
Spoke with patient and she is asking to switch to Dr. Hilarie Fredrickson. Please, advise.

## 2015-05-25 NOTE — Telephone Encounter (Signed)
Left a message for patient to call back. 

## 2015-05-25 NOTE — Telephone Encounter (Signed)
Spoke with patient and told her Dr. Hilarie Fredrickson agreed to take her. Scheduled OV for patient with Dr. Hilarie Fredrickson.

## 2015-05-27 ENCOUNTER — Telehealth: Payer: Self-pay | Admitting: *Deleted

## 2015-05-27 NOTE — Telephone Encounter (Signed)
-----   Message from Lafayette Dragon, MD sent at 05/27/2015  3:30 PM EDT ----- Regarding: EGD Rollene Fare, Dr Clayburn Pert called me concerning Ms Due who continues to have epigastric pain, refractory to med I gave her on 05/18/2015. She wants her to have EGD ( last one 2013). Plerase arrange for her to have an EGD ( however it works out- may need to see a PA).

## 2015-05-27 NOTE — Telephone Encounter (Signed)
Left a message for patient to call back. 

## 2015-05-30 NOTE — Telephone Encounter (Signed)
Spoke with patient and she states she feels bad, thinks she is dehydrated. She states she is going to ED. States she will call back if she decides to see PA.

## 2015-07-16 DEATH — deceased

## 2015-08-01 ENCOUNTER — Ambulatory Visit: Payer: Medicare Other | Admitting: Internal Medicine

## 2017-02-27 ENCOUNTER — Encounter: Payer: Self-pay | Admitting: Gynecology
